# Patient Record
Sex: Female | Born: 1965 | Hispanic: Yes | Smoking: Never smoker
Health system: Southern US, Community
[De-identification: ages and names within clinical notes are randomized; demographics above are authoritative.]

## PROBLEM LIST (undated history)

## (undated) DIAGNOSIS — F329 Major depressive disorder, single episode, unspecified: Secondary | ICD-10-CM

## (undated) DIAGNOSIS — F32A Depression, unspecified: Secondary | ICD-10-CM

## (undated) DIAGNOSIS — G510 Bell's palsy: Secondary | ICD-10-CM

## (undated) HISTORY — PX: ABDOMINAL HYSTERECTOMY: SHX81

## (undated) HISTORY — PX: VAGINA SURGERY: SHX829

---

## 1999-07-24 ENCOUNTER — Encounter: Admission: RE | Admit: 1999-07-24 | Discharge: 1999-07-24 | Payer: Self-pay | Admitting: *Deleted

## 1999-08-05 ENCOUNTER — Emergency Department (HOSPITAL_COMMUNITY): Admission: EM | Admit: 1999-08-05 | Discharge: 1999-08-05 | Payer: Self-pay | Admitting: Emergency Medicine

## 2009-04-12 ENCOUNTER — Emergency Department (HOSPITAL_BASED_OUTPATIENT_CLINIC_OR_DEPARTMENT_OTHER): Admission: EM | Admit: 2009-04-12 | Discharge: 2009-04-12 | Payer: Self-pay | Admitting: Emergency Medicine

## 2011-03-10 LAB — RAPID STREP SCREEN (MED CTR MEBANE ONLY): Streptococcus, Group A Screen (Direct): POSITIVE — AB

## 2015-03-27 ENCOUNTER — Encounter (HOSPITAL_BASED_OUTPATIENT_CLINIC_OR_DEPARTMENT_OTHER): Payer: Self-pay

## 2015-03-27 ENCOUNTER — Emergency Department (HOSPITAL_BASED_OUTPATIENT_CLINIC_OR_DEPARTMENT_OTHER)
Admission: EM | Admit: 2015-03-27 | Discharge: 2015-03-27 | Disposition: A | Discharge status: Home / Self Care | Payer: Medicare Other | Attending: Emergency Medicine | Admitting: Emergency Medicine

## 2015-03-27 DIAGNOSIS — S4992XA Unspecified injury of left shoulder and upper arm, initial encounter: Secondary | ICD-10-CM | POA: Diagnosis not present

## 2015-03-27 DIAGNOSIS — S39012A Strain of muscle, fascia and tendon of lower back, initial encounter: Secondary | ICD-10-CM | POA: Insufficient documentation

## 2015-03-27 DIAGNOSIS — Y999 Unspecified external cause status: Secondary | ICD-10-CM | POA: Diagnosis not present

## 2015-03-27 DIAGNOSIS — S24109A Unspecified injury at unspecified level of thoracic spinal cord, initial encounter: Secondary | ICD-10-CM | POA: Diagnosis not present

## 2015-03-27 DIAGNOSIS — S0990XA Unspecified injury of head, initial encounter: Secondary | ICD-10-CM | POA: Insufficient documentation

## 2015-03-27 DIAGNOSIS — S4991XA Unspecified injury of right shoulder and upper arm, initial encounter: Secondary | ICD-10-CM | POA: Insufficient documentation

## 2015-03-27 DIAGNOSIS — S161XXA Strain of muscle, fascia and tendon at neck level, initial encounter: Secondary | ICD-10-CM | POA: Diagnosis not present

## 2015-03-27 DIAGNOSIS — Y9389 Activity, other specified: Secondary | ICD-10-CM | POA: Insufficient documentation

## 2015-03-27 DIAGNOSIS — S199XXA Unspecified injury of neck, initial encounter: Secondary | ICD-10-CM | POA: Diagnosis present

## 2015-03-27 DIAGNOSIS — Y92410 Unspecified street and highway as the place of occurrence of the external cause: Secondary | ICD-10-CM | POA: Diagnosis not present

## 2015-03-27 LAB — URINALYSIS, ROUTINE W REFLEX MICROSCOPIC
BILIRUBIN URINE: NEGATIVE
GLUCOSE, UA: NEGATIVE mg/dL
HGB URINE DIPSTICK: NEGATIVE
Ketones, ur: NEGATIVE mg/dL
Nitrite: NEGATIVE
Protein, ur: NEGATIVE mg/dL
Specific Gravity, Urine: 1.021 (ref 1.005–1.030)
Urobilinogen, UA: 0.2 mg/dL (ref 0.0–1.0)
pH: 5.5 (ref 5.0–8.0)

## 2015-03-27 LAB — URINE MICROSCOPIC-ADD ON

## 2015-03-27 MED ORDER — CYCLOBENZAPRINE HCL 10 MG PO TABS
10.0000 mg | ORAL_TABLET | Freq: Once | ORAL | Status: AC
  Administered 2015-03-27: 10 mg via ORAL
  Filled 2015-03-27: qty 1

## 2015-03-27 MED ORDER — KETOROLAC TROMETHAMINE 60 MG/2ML IM SOLN
60.0000 mg | Freq: Once | INTRAMUSCULAR | Status: AC
  Administered 2015-03-27: 60 mg via INTRAMUSCULAR
  Filled 2015-03-27: qty 2

## 2015-03-27 MED ORDER — CYCLOBENZAPRINE HCL 10 MG PO TABS: 10.0000 mg | ORAL_TABLET | Freq: Two times a day (BID) | ORAL | Status: DC | PRN

## 2015-03-27 MED ORDER — NAPROXEN 500 MG PO TABS: 500.0000 mg | ORAL_TABLET | Freq: Two times a day (BID) | ORAL | Status: DC

## 2015-03-27 NOTE — ED Notes (Signed)
[employer] on Sunday  ? 5 minutes loc,  C/o back.neck, shoulder and head pain
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
## 2015-03-29 LAB — URINE CULTURE: Colony Count: 45000

## 2015-07-17 ENCOUNTER — Encounter (HOSPITAL_COMMUNITY): Payer: Self-pay | Admitting: *Deleted

## 2015-07-17 DIAGNOSIS — R200 Anesthesia of skin: Secondary | ICD-10-CM | POA: Insufficient documentation

## 2015-07-17 DIAGNOSIS — Z79899 Other long term (current) drug therapy: Secondary | ICD-10-CM | POA: Insufficient documentation

## 2015-07-17 DIAGNOSIS — Z7952 Long term (current) use of systemic steroids: Secondary | ICD-10-CM | POA: Insufficient documentation

## 2015-07-17 DIAGNOSIS — R51 Headache: Secondary | ICD-10-CM | POA: Insufficient documentation

## 2015-07-17 DIAGNOSIS — G510 Bell's palsy: Secondary | ICD-10-CM | POA: Diagnosis not present

## 2015-07-17 NOTE — ED Notes (Signed)
The pt was diagnosed with bells palsy 2 weeks ago.  Since then she has had  A headache and sone neck stiffness with pressure.lmp none c/o nausea

## 2015-07-18 ENCOUNTER — Emergency Department (HOSPITAL_COMMUNITY)
Admission: EM | Admit: 2015-07-18 | Discharge: 2015-07-18 | Disposition: A | Discharge status: Home / Self Care | Payer: Medicare Other | Attending: Emergency Medicine | Admitting: Emergency Medicine

## 2015-07-18 ENCOUNTER — Emergency Department (HOSPITAL_COMMUNITY): Payer: Medicare Other

## 2015-07-18 ENCOUNTER — Encounter (HOSPITAL_COMMUNITY): Payer: Self-pay

## 2015-07-18 DIAGNOSIS — R519 Headache, unspecified: Secondary | ICD-10-CM

## 2015-07-18 DIAGNOSIS — R51 Headache: Secondary | ICD-10-CM

## 2015-07-18 DIAGNOSIS — R200 Anesthesia of skin: Secondary | ICD-10-CM

## 2015-07-18 DIAGNOSIS — G510 Bell's palsy: Secondary | ICD-10-CM

## 2015-07-18 LAB — COMPREHENSIVE METABOLIC PANEL
ALBUMIN: 3.9 g/dL (ref 3.5–5.0)
ALK PHOS: 60 U/L (ref 38–126)
ALT: 17 U/L (ref 14–54)
ANION GAP: 9 (ref 5–15)
AST: 23 U/L (ref 15–41)
BILIRUBIN TOTAL: 0.7 mg/dL (ref 0.3–1.2)
BUN: 12 mg/dL (ref 6–20)
CALCIUM: 8.9 mg/dL (ref 8.9–10.3)
CO2: 24 mmol/L (ref 22–32)
Chloride: 106 mmol/L (ref 101–111)
Creatinine, Ser: 0.9 mg/dL (ref 0.44–1.00)
GFR calc Af Amer: >60 mL/min (ref >60)
GLUCOSE: 86 mg/dL (ref 65–99)
Potassium: 3.5 mmol/L (ref 3.5–5.1)
Sodium: 139 mmol/L (ref 135–145)
TOTAL PROTEIN: 6.5 g/dL (ref 6.5–8.1)

## 2015-07-18 LAB — DIFFERENTIAL
Basophils Absolute: 0 10*3/uL (ref 0.0–0.1)
Basophils Relative: 0 % (ref 0–1)
EOS PCT: 1 % (ref 0–5)
Eosinophils Absolute: 0.1 10*3/uL (ref 0.0–0.7)
LYMPHS ABS: 3.2 10*3/uL (ref 0.7–4.0)
LYMPHS PCT: 33 % (ref 12–46)
MONOS PCT: 6 % (ref 3–12)
Monocytes Absolute: 0.6 10*3/uL (ref 0.1–1.0)
NEUTROS PCT: 60 % (ref 43–77)
Neutro Abs: 5.9 10*3/uL (ref 1.7–7.7)

## 2015-07-18 LAB — CSF CELL COUNT WITH DIFFERENTIAL
RBC COUNT CSF: 1 /mm3 — AB
RBC Count, CSF: 0 /mm3
Tube #: 1
Tube #: 4
WBC CSF: 2 /mm3 (ref 0–5)
WBC, CSF: 1 /mm3 (ref 0–5)

## 2015-07-18 LAB — URINE MICROSCOPIC-ADD ON

## 2015-07-18 LAB — PROTIME-INR
INR: 1.06 (ref 0.00–1.49)
Prothrombin Time: 14 seconds (ref 11.6–15.2)

## 2015-07-18 LAB — CBC
HEMATOCRIT: 40.8 % (ref 36.0–46.0)
HEMOGLOBIN: 14 g/dL (ref 12.0–15.0)
MCH: 29.9 pg (ref 26.0–34.0)
MCHC: 34.3 g/dL (ref 30.0–36.0)
MCV: 87.2 fL (ref 78.0–100.0)
Platelets: 282 10*3/uL (ref 150–400)
RBC: 4.68 MIL/uL (ref 3.87–5.11)
RDW: 13.3 % (ref 11.5–15.5)
WBC: 9.8 10*3/uL (ref 4.0–10.5)

## 2015-07-18 LAB — URINALYSIS, ROUTINE W REFLEX MICROSCOPIC
Bilirubin Urine: NEGATIVE
GLUCOSE, UA: NEGATIVE mg/dL
Ketones, ur: NEGATIVE mg/dL
Nitrite: NEGATIVE
PH: 5.5 (ref 5.0–8.0)
Protein, ur: NEGATIVE mg/dL
SPECIFIC GRAVITY, URINE: 1.011 (ref 1.005–1.030)
Urobilinogen, UA: 0.2 mg/dL (ref 0.0–1.0)

## 2015-07-18 LAB — APTT: aPTT: 27 seconds (ref 24–37)

## 2015-07-18 LAB — I-STAT TROPONIN, ED: TROPONIN I, POC: 0 ng/mL (ref 0.00–0.08)

## 2015-07-18 LAB — ETHANOL: Alcohol, Ethyl (B): <5 mg/dL (ref <5)

## 2015-07-18 MED ORDER — IOHEXOL 350 MG/ML SOLN
50.0000 mL | Freq: Once | INTRAVENOUS | Status: AC | PRN
  Administered 2015-07-18: 50 mL via INTRAVENOUS

## 2015-07-18 MED ORDER — SODIUM CHLORIDE 0.9 % IV BOLUS (SEPSIS)
1000.0000 mL | Freq: Once | INTRAVENOUS | Status: AC
  Administered 2015-07-18: 1000 mL via INTRAVENOUS

## 2015-07-18 MED ORDER — NAPROXEN 500 MG PO TABS
500.0000 mg | ORAL_TABLET | Freq: Two times a day (BID) | ORAL | Status: DC | PRN
Start: 2015-07-18 — End: 2017-02-02

## 2015-07-18 MED ORDER — KETOROLAC TROMETHAMINE 30 MG/ML IJ SOLN
30.0000 mg | Freq: Once | INTRAMUSCULAR | Status: AC
  Administered 2015-07-18: 30 mg via INTRAVENOUS
  Filled 2015-07-18: qty 1

## 2015-07-18 MED ORDER — METOCLOPRAMIDE HCL 5 MG/ML IJ SOLN
10.0000 mg | Freq: Once | INTRAMUSCULAR | Status: AC
  Administered 2015-07-18: 10 mg via INTRAVENOUS
  Filled 2015-07-18: qty 2

## 2015-07-18 MED ORDER — FENTANYL CITRATE (PF) 100 MCG/2ML IJ SOLN
50.0000 ug | Freq: Once | INTRAMUSCULAR | Status: AC
  Administered 2015-07-18: 50 ug via INTRAVENOUS
  Filled 2015-07-18: qty 2

## 2015-07-18 NOTE — ED Notes (Signed)
Pt back from MRI 

## 2015-07-18 NOTE — ED Notes (Signed)
Pt to CT

## 2015-07-18 NOTE — ED Notes (Signed)
Pt to MRI

## 2015-07-18 NOTE — ED Notes (Signed)
Pt to ED c/o L posterior headache. Reports hx of one migraine in 2000, reports this headache feels worse. Pt ambulatory without difficulty. Recently diagnosed with "facial palsy" and has been taking "a pill that makes my headache worse." Denies photophobia.

## 2015-07-18 NOTE — ED Notes (Signed)
LP tray at bedside

## 2015-07-18 NOTE — ED Provider Notes (Signed)
CSN: [id number]     Arrival date & time 07/17/15  2214 History  This chart was scribed for Derwood Kaplan, MD by Octavia Heir, ED Scribe. This patient was seen in room D30C/D30C and the patient's care was started at 1:00 AM.    Chief Complaint  Patient presents with  . Headache     The history is provided by the patient. No language interpreter was used.   HPI Comments: Virginia King is a 49 y.o. female who presents to the Emergency Department complaining of constant, gradual worsening left sided headache onset 2 weeks ago. Pt also has associated left arm weakness and numbness that started two days ago. Pt notes she has mild blurry vision. She states the pain radiates from the left side of her head to her neck and shoulder that started last night. Per daughter, pt was dizzy today and was very nauseas. Pt has trouble sleeping at night due to the pain. Pt was dx with bells palsy 2 weeks ago at [facility]. Pt told them about her headache and was given tylenol to alleviate the pain with no relief. Pt has maternal family hx of brain bleeds. Pt denies fevers, recent travel abroad. Pt is a non-smoker.   History reviewed. No pertinent past medical history. History reviewed. No pertinent past surgical history. No family history on file. Social History  Substance Use Topics  . Smoking status: Never Smoker   . Smokeless tobacco: None  . Alcohol Use: No   OB History    No data available     Review of Systems  A complete 10 system review of systems was obtained and all systems are negative except as noted in the HPI and PMH.    Allergies  Review of patient's allergies indicates no known allergies.  Home Medications   Prior to Admission medications   Medication Sig Start Date End Date Taking? Authorizing Provider  paliperidone (INVEGA) 9 MG 24 hr tablet Take 18 mg by mouth daily.   Yes Historical Provider, MD  predniSONE (DELTASONE) 20 MG tablet Take 60 mg by mouth daily with breakfast.    Yes Historical Provider, MD  QUEtiapine (SEROQUEL XR) 200 MG 24 hr tablet Take 600 mg by mouth at bedtime.   Yes Historical Provider, MD  sertraline (ZOLOFT) 100 MG tablet Take 200 mg by mouth daily.   Yes Historical Provider, MD  traZODone (DESYREL) 100 MG tablet Take 100 mg by mouth at bedtime as needed for sleep.   Yes Historical Provider, MD  valACYclovir (VALTREX) 1000 MG tablet Take 1,000 mg by mouth 3 (three) times daily.   Yes Historical Provider, MD  zolpidem (AMBIEN) 10 MG tablet Take 10 mg by mouth at bedtime as needed for sleep.   Yes Historical Provider, MD    Triage vitals: BP 131/83 mmHg  Pulse 58  Temp(Src) 98.5 F (36.9 C) (Oral)  Resp 20  SpO2 100% Physical Exam  Constitutional: She is oriented to person, place, and time. She appears well-developed and well-nourished. No distress.  HENT:  Head: Normocephalic and atraumatic.  Eyes: EOM are normal.  Neck: Normal range of motion.  Mild neck stiffness but ROM is normal, no carotid bruey, no nuchal rigidity, no meningismus   Cardiovascular: Normal rate, regular rhythm and normal heart sounds.   Pulmonary/Chest: Effort normal and breath sounds normal.  Lungs are clear  Abdominal: Soft. She exhibits no distension. There is no tenderness.  Musculoskeletal: Normal range of motion.  LUE numbness, LLE numbness  Neurological:  She is alert and oriented to person, place, and time.   Pupils are 3mm, extraocular muscles intact, left sided facial paralysis, no central sparing of the forehead, left sided numbness, 4+ out of 5 strength bilaterally but pt has drift of left upper extremity, 4+ out of 5 strength bilateral lower extremity, positive drift for left lower extremity  Skin: Skin is warm and dry.  Psychiatric: She has a normal mood and affect. Judgment normal.  Nursing note and vitals reviewed.   ED Course  LUMBAR PUNCTURE Date/Time: 07/18/2015 9:15 AM Performed by: Derwood Kaplan Authorized by: Derwood Kaplan Consent:  Verbal consent obtained. Risks and benefits: risks, benefits and alternatives were discussed Patient understanding: patient states understanding of the procedure being performed Test results: test results available and properly labeled Site marked: the operative site was marked Patient identity confirmed: arm band Indications: evaluation for subarachnoid hemorrhage Anesthesia: local infiltration Local anesthetic: lidocaine 1% with epinephrine Anesthetic total: 2 ml Patient sedated: no Preparation: Patient was prepped and draped in the usual sterile fashion. Lumbar space: L4-L5 interspace Patient's position: sitting Needle gauge: 18 Needle type: diamond point Number of attempts: 2 Fluid appearance: clear Tubes of fluid: 4 Total volume: 10 ml Post-procedure: site cleaned, pressure dressing applied and adhesive bandage applied Patient tolerance: Patient tolerated the procedure well with no immediate complications    DIAGNOSTIC STUDIES: Oxygen Saturation is 100% on RA, normal by my interpretation.  COORDINATION OF CARE:  1:13 AM Discussed treatment plan which includes CT of head, pain medication with pt at bedside and pt agreed to plan.  Labs Review Labs Reviewed  URINALYSIS, ROUTINE W REFLEX MICROSCOPIC (NOT AT [facility]) - Abnormal; Notable for the following:    Hgb urine dipstick TRACE (*)    Leukocytes, UA TRACE (*)    All other components within normal limits  URINE MICROSCOPIC-ADD ON - Abnormal; Notable for the following:    Squamous Epithelial / LPF FEW (*)    Bacteria, UA FEW (*)    All other components within normal limits  CSF CULTURE  ETHANOL  PROTIME-INR  APTT  CBC  DIFFERENTIAL  COMPREHENSIVE METABOLIC PANEL  CSF CELL COUNT WITH DIFFERENTIAL  CSF CELL COUNT WITH DIFFERENTIAL  I-STAT TROPOININ, ED    Imaging Review Ct Angio Head W/cm &/or Wo Cm  07/18/2015   CLINICAL DATA:  Initial evaluation for acute neck pain, headache. History of aneurysm.  EXAM: CT  ANGIOGRAPHY HEAD AND NECK  TECHNIQUE: Multidetector CT imaging of the head and neck was performed using the standard protocol during bolus administration of intravenous contrast. Multiplanar CT image reconstructions and MIPs were obtained to evaluate the vascular anatomy. Carotid stenosis measurements (when applicable) are obtained utilizing NASCET criteria, using the distal internal carotid diameter as the denominator.  CONTRAST:  50mL OMNIPAQUE IOHEXOL 350 MG/ML SOLN  COMPARISON:  Prior noncontrast head CT from earlier the same day.  FINDINGS: CTA NECK  Aortic arch: Visualized aortic arch is of normal caliber. Incidental note made of a bovine arch with common origin of the right brachiocephalic and left common carotid arteries. No high-grade stenosis seen at the origin of the great vessels. Visualized subclavian arteries are widely patent.  Right carotid system: Evaluation of the proximal right common carotid artery somewhat limited by motion artifact, but is grossly patent. Right common carotid artery well opacified from its origin to the carotid bifurcation. Right internal carotid artery widely patent from the carotid bifurcation to the skullbase. No evidence for dissection, stenosis, or occlusion.  Left carotid  system: Left common carotid artery widely patent from its origin to the carotid bifurcation. Left internal carotid artery widely patent from the carotid bifurcation to the skullbase. No evidence for dissection, occlusion, or stenosis.  Vertebral arteries:Both of the vertebral arteries arise from the subclavian arteries. Vertebral arteries are well opacified along their entire course without evidence for dissection, occlusion, or stenosis.  Skeleton: No acute osseous abnormality. No worrisome lytic or blastic osseous lesions.  Other neck: Visualized lungs are clear. Subcentimeter hypodense nodule noted within the right lobe of thyroid, of doubtful clinical significance. Thyroid otherwise unremarkable. No  adenopathy. No acute soft tissue abnormality within the neck.  CTA HEAD  Anterior circulation: The petrous, cavernous, and supraclinoid segments of the internal carotid arteries are widely patent bilaterally. There is a small 2-3 mm focal outpouching arising from the supra clinoid left ICA at the takeoff of the left ophthalmic artery, suspicious for a small aneurysm. This is directed laterally and slightly anteriorly.  A1 segments, anterior communicating artery, and anterior cerebral arteries well opacified bilaterally.  M1 segments widely patent bilaterally. MCA bifurcations within normal limits. Distal MCA branches well opacified and symmetric.  Posterior circulation: Vertebral arteries are widely patent to the vertebrobasilar junction. Right vertebral artery slightly dominant. Posterior inferior cerebral arteries patent bilaterally. Basilar artery widely patent. Superior cerebellar arteries are patent proximally. There is fetal origin of the left posterior cerebral artery. Right P1 segment patent. Posterior cerebral arteries are opacified to their distal aspects.  Venous sinuses: No acute abnormality within the venous sinuses.  Anatomic variants: Fetal origin of the left PCA.  Delayed phase: No abnormal enhancement on delayed sequence.  IMPRESSION: 1. Normal CTA of the neck without acute abnormality or stenosis. 2. 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 Otherwise normal CTA of the brain.   Electronically Signed   By: Rise Mu M.D.   On: 07/18/2015 04:32   Ct Head Wo Contrast  07/18/2015   CLINICAL DATA:  Left posterior headache and left-sided weakness.  EXAM: CT HEAD WITHOUT CONTRAST  TECHNIQUE: Contiguous axial images were obtained from the base of the skull through the vertex without intravenous contrast.  COMPARISON:  None available  FINDINGS: Skull and Sinuses:Negative for fracture or destructive process. The mastoids, middle ears, and imaged paranasal sinuses are clear.  Orbits: No acute  abnormality.  Brain: Unremarkable. No evidence of acute infarction, hemorrhage, hydrocephalus, or mass lesion/mass effect.  IMPRESSION: Negative head CT.   Electronically Signed   By: Marnee Spring M.D.   On: 07/18/2015 02:35   Ct Angio Neck W/cm &/or Wo/cm  07/18/2015   CLINICAL DATA:  Initial evaluation for acute neck pain, headache. History of aneurysm.  EXAM: CT ANGIOGRAPHY HEAD AND NECK  TECHNIQUE: Multidetector CT imaging of the head and neck was performed using the standard protocol during bolus administration of intravenous contrast. Multiplanar CT image reconstructions and MIPs were obtained to evaluate the vascular anatomy. Carotid stenosis measurements (when applicable) are obtained utilizing NASCET criteria, using the distal internal carotid diameter as the denominator.  CONTRAST:  50mL OMNIPAQUE IOHEXOL 350 MG/ML SOLN  COMPARISON:  Prior noncontrast head CT from earlier the same day.  FINDINGS: CTA NECK  Aortic arch: Visualized aortic arch is of normal caliber. Incidental note made of a bovine arch with common origin of the right brachiocephalic and left common carotid arteries. No high-grade stenosis seen at the origin of the great vessels. Visualized subclavian arteries are widely patent.  Right carotid system: Evaluation of the proximal right common  carotid artery somewhat limited by motion artifact, but is grossly patent. Right common carotid artery well opacified from its origin to the carotid bifurcation. Right internal carotid artery widely patent from the carotid bifurcation to the skullbase. No evidence for dissection, stenosis, or occlusion.  Left carotid system: Left common carotid artery widely patent from its origin to the carotid bifurcation. Left internal carotid artery widely patent from the carotid bifurcation to the skullbase. No evidence for dissection, occlusion, or stenosis.  Vertebral arteries:Both of the vertebral arteries arise from the subclavian arteries. Vertebral  arteries are well opacified along their entire course without evidence for dissection, occlusion, or stenosis.  Skeleton: No acute osseous abnormality. No worrisome lytic or blastic osseous lesions.  Other neck: Visualized lungs are clear. Subcentimeter hypodense nodule noted within the right lobe of thyroid, of doubtful clinical significance. Thyroid otherwise unremarkable. No adenopathy. No acute soft tissue abnormality within the neck.  CTA HEAD  Anterior circulation: The petrous, cavernous, and supraclinoid segments of the internal carotid arteries are widely patent bilaterally. There is a small 2-3 mm focal outpouching arising from the supra clinoid left ICA at the takeoff of the left ophthalmic artery, suspicious for a small aneurysm. This is directed laterally and slightly anteriorly.  A1 segments, anterior communicating artery, and anterior cerebral arteries well opacified bilaterally.  M1 segments widely patent bilaterally. MCA bifurcations within normal limits. Distal MCA branches well opacified and symmetric.  Posterior circulation: Vertebral arteries are widely patent to the vertebrobasilar junction. Right vertebral artery slightly dominant. Posterior inferior cerebral arteries patent bilaterally. Basilar artery widely patent. Superior cerebellar arteries are patent proximally. There is fetal origin of the left posterior cerebral artery. Right P1 segment patent. Posterior cerebral arteries are opacified to their distal aspects.  Venous sinuses: No acute abnormality within the venous sinuses.  Anatomic variants: Fetal origin of the left PCA.  Delayed phase: No abnormal enhancement on delayed sequence.  IMPRESSION: 1. Normal CTA of the neck without acute abnormality or stenosis. 2. 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 Otherwise normal CTA of the brain.   Electronically Signed   By: Rise Mu M.D.   On: 07/18/2015 04:32   Mr Brain Wo Contrast  07/18/2015   CLINICAL DATA:  49 year old female  with severe left side headache x2 weeks. Left upper extremity weakness and numbness for 2 days. Blurred vision. Diagnosed with Bell palsy 2 weeks ago. Initial encounter.  EXAM: MRI HEAD WITHOUT CONTRAST  TECHNIQUE: Multiplanar, multiecho pulse sequences of the brain and surrounding structures were obtained without intravenous contrast.  COMPARISON:  CTA head and neck 0333 hours today.  FINDINGS: Cerebral volume is normal. No restricted diffusion to suggest acute infarction. No midline shift, mass effect, evidence of mass lesion, ventriculomegaly, extra-axial collection or acute intracranial hemorrhage. Cervicomedullary junction and pituitary are within normal limits. Major intracranial vascular flow voids are preserved.  Wallace Cullens and white matter signal is within normal limits for age throughout the brain. Grossly normal visualized internal auditory structures. Mastoids are clear. Stylomastoid foramina appear normal. Visualized parotid glands are within normal limits.  Visualized orbit and scalp soft tissues are within normal limits. Paranasal sinuses are clear. Negative visualized cervical spine. Visualized bone marrow signal is within normal limits.  IMPRESSION: No acute intracranial abnormality. Normal for age noncontrast MRI appearance of the brain.   Electronically Signed   By: Odessa Fleming M.D.   On: 07/18/2015 07:52   I have personally reviewed and evaluated lab results as part of my medical decision-making.  EKG Interpretation   Date/Time:  Thursday July 18 2015 02:13:54 EDT Ventricular Rate:  54 PR Interval:  148 QRS Duration: 97 QT Interval:  440 QTC Calculation: 417 R Axis:   56 Text Interpretation:  Sinus rhythm Low voltage, precordial leads No acute  changes No old tracing to compare Confirmed by Rhunette Croft, MD, Janey Genta [phone])  on 07/18/2015 2:55:31 AM      MDM   Final diagnoses:  Bad headache  Bell's palsy  Left sided numbness    I personally performed the services described in this  documentation, which was scribed in my presence. The recorded information has been reviewed and is accurate.  PT comes in with cc of headache. Pt reports that the headache is severe, the worst headache she has ever had. The headache is also new. Hx of brain  Bleed in the family. Pt certainly has L sided bell's palsy on exam, but she also has new weakness and numbness to the L side. CT head and CT-A ordered - there is no subacute stroke or carotid dissection. There is a small AN on the L side, which is not the culprit for her L sided symptoms - pt notified of the small incidental aneurysm, and she will f/u with Neurology for monitoring.  Pt given fentanyl and then toradol and reglan. She still complained of severe headache. Neuro exam unchanged.  I spoke with Dr. Leroy Kennedy over the phone, and the Neurologist advised MRI to ensure no small infarct. The MRI will also exclude venous and dural thrombosis that can cause headaches. He also advised LP to r/o bleed.  Results discussed with the patient and LP conversation was completed. She agreed to proceed with LP. LP was completed w/o any complications. MRI is neg - pt made aware of it. Dr. Criss Alvine to f/u with the CSF analysis, and d/c pt if they appear normal.   Derwood Kaplan, MD 07/18/15 [phone]

## 2015-07-18 NOTE — ED Notes (Signed)
Pt in MRI.

## 2015-07-18 NOTE — ED Provider Notes (Signed)
11:31 AM Patient's LP is normal. Normal MRI as well. Plan for d/c with neurology follow up and she is requesting ENT f/u for her Bell's Palsy. She is already on steroids and valacyclovir. Will treat headache with NSAIDs. Verbalized understanding of return precautions.  Pricilla Loveless, MD 07/18/15 [phone]

## 2015-07-18 NOTE — Discharge Instructions (Signed)
We saw you in the ER for headaches. All the labs and imaging are normal. 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  We are not sure what is causing your headaches, however, there appears to be no evidence of infection, bleeds or tumors based on our exam and results.  Please take motrin round the clock for the next 6 hours, and take other meds prescribed only for break through pain. See the neurologist if the pain persists, as you might need better medications or a specialist.  PLEASE LAY FLAT IN THE BED AS LONG AS POSSIBLE.   Headaches, Frequently Asked Questions MIGRAINE HEADACHES Q: What is migraine? What causes it? How can I treat it? A: Generally, migraine headaches begin as a dull ache. Then they develop into a constant, throbbing, and pulsating pain. You may experience pain at the temples. You may experience pain at the front or back of one or both sides of the head. The pain is usually accompanied by a combination of:  Nausea.  Vomiting.  Sensitivity to light and noise. Some people (about 15%) experience an aura (see below) before an attack. The cause of migraine is believed to be chemical reactions in the brain. Treatment for migraine may include over-the-counter or prescription medications. It may also include self-help techniques. These include relaxation training and biofeedback.  Q: What is an aura? A: About 15% of people with migraine get an "aura". This is a sign of neurological symptoms that occur before a migraine headache. You may see wavy or jagged lines, dots, or flashing lights. You might experience tunnel vision or blind spots in one or both eyes. The aura can include visual or auditory hallucinations (something imagined). It may include disruptions in smell (such as strange odors), taste or touch. Other symptoms include:  Numbness.  A "pins and needles" sensation.  Difficulty in recalling or speaking the correct word. These  neurological events may last as long as 60 minutes. These symptoms will fade as the headache begins. Q: What is a trigger? A: Certain physical or environmental factors can lead to or "trigger" a migraine. These include:  Foods.  Hormonal changes.  Weather.  Stress. It is important to remember that triggers are different for everyone. To help prevent migraine attacks, you need to figure out which triggers affect you. Keep a headache diary. This is a good way to track triggers. The diary will help you talk to your healthcare professional about your condition. Q: Does weather affect migraines? A: Bright sunshine, hot, humid conditions, and drastic changes in barometric pressure may lead to, or "trigger," a migraine attack in some people. But studies have shown that weather does not act as a trigger for everyone with migraines. Q: What is the link between migraine and hormones? A: Hormones start and regulate many of your body's functions. Hormones keep your body in balance within a constantly changing environment. The levels of hormones in your body are unbalanced at times. Examples are during menstruation, pregnancy, or menopause. That can lead to a migraine attack. In fact, about three quarters of all women with migraine report that their attacks are related to the menstrual cycle.  Q: Is there an increased risk of stroke for migraine sufferers? A: The likelihood of a migraine attack causing a stroke is very remote. That is not to say that migraine sufferers cannot have a stroke associated with their migraines. In persons under age 28, the most common associated factor for  stroke is migraine headache. But over the course of a person's normal life span, the occurrence of migraine headache may actually be associated with a reduced risk of dying from cerebrovascular disease due to stroke.  Q: What are acute medications for migraine? A: Acute medications are used to treat the pain of the headache after  it has started. Examples over-the-counter medications, NSAIDs, ergots, and triptans.  Q: What are the triptans? A: Triptans are the newest class of abortive medications. They are specifically targeted to treat migraine. Triptans are vasoconstrictors. They moderate some chemical reactions in the brain. The triptans work on receptors in your brain. Triptans help to restore the balance of a neurotransmitter called serotonin. Fluctuations in levels of serotonin are thought to be a main cause of migraine.  Q: Are over-the-counter medications for migraine effective? A: Over-the-counter, or "OTC," medications may be effective in relieving mild to moderate pain and associated symptoms of migraine. But you should see your caregiver before beginning any treatment regimen for migraine.  Q: What are preventive medications for migraine? A: Preventive medications for migraine are sometimes referred to as "prophylactic" treatments. They are used to reduce the frequency, severity, and length of migraine attacks. Examples of preventive medications include antiepileptic medications, antidepressants, beta-blockers, calcium channel blockers, and NSAIDs (nonsteroidal anti-inflammatory drugs). Q: Why are anticonvulsants used to treat migraine? A: During the past few years, there has been an increased interest in antiepileptic drugs for the prevention of migraine. They are sometimes referred to as "anticonvulsants". Both epilepsy and migraine may be caused by similar reactions in the brain.  Q: Why are antidepressants used to treat migraine? A: Antidepressants are typically used to treat people with depression. They may reduce migraine frequency by regulating chemical levels, such as serotonin, in the brain.  Q: What alternative therapies are used to treat migraine? A: The term "alternative therapies" is often used to describe treatments considered outside the scope of conventional Western medicine. Examples of alternative  therapy include acupuncture, acupressure, and yoga. Another common alternative treatment is herbal therapy. Some herbs are believed to relieve headache pain. Always discuss alternative therapies with your caregiver before proceeding. Some herbal products contain arsenic and other toxins. TENSION HEADACHES Q: What is a tension-type headache? What causes it? How can I treat it? A: Tension-type headaches occur randomly. They are often the result of temporary stress, anxiety, fatigue, or anger. Symptoms include soreness in your temples, a tightening band-like sensation around your head (a "vice-like" ache). Symptoms can also include a pulling feeling, pressure sensations, and contracting head and neck muscles. The headache begins in your forehead, temples, or the back of your head and neck. Treatment for tension-type headache may include over-the-counter or prescription medications. Treatment may also include self-help techniques such as relaxation training and biofeedback. CLUSTER HEADACHES Q: What is a cluster headache? What causes it? How can I treat it? A: Cluster headache gets its name because the attacks come in groups. The pain arrives with little, if any, warning. It is usually on one side of the head. A tearing or bloodshot eye and a runny nose on the same side of the headache may also accompany the pain. Cluster headaches are believed to be caused by chemical reactions in the brain. They have been described as the most severe and intense of any headache type. Treatment for cluster headache includes prescription medication and oxygen. SINUS HEADACHES Q: What is a sinus headache? What causes it? How can I treat it? A: When a cavity in  the bones of the face and skull (a sinus) becomes inflamed, the inflammation will cause localized pain. This condition is usually the result of an allergic reaction, a tumor, or an infection. If your headache is caused by a sinus blockage, such as an infection, you will  probably have a fever. An x-ray will confirm a sinus blockage. Your caregiver's treatment might include antibiotics for the infection, as well as antihistamines or decongestants.  REBOUND HEADACHES Q: What is a rebound headache? What causes it? How can I treat it? A: A pattern of taking acute headache medications too often can lead to a condition known as "rebound headache." A pattern of taking too much headache medication includes taking it more than 2 days per week or in excessive amounts. That means more than the label or a caregiver advises. With rebound headaches, your medications not only stop relieving pain, they actually begin to cause headaches. Doctors treat rebound headache by tapering the medication that is being overused. Sometimes your caregiver will gradually substitute a different type of treatment or medication. Stopping may be a challenge. Regularly overusing a medication increases the potential for serious side effects. Consult a caregiver if you regularly use headache medications more than 2 days per week or more than the label advises. ADDITIONAL QUESTIONS AND ANSWERS Q: What is biofeedback? A: Biofeedback is a self-help treatment. Biofeedback uses special equipment to monitor your body's involuntary physical responses. Biofeedback monitors:  Breathing.  Pulse.  Heart rate.  Temperature.  Muscle tension.  Brain activity. Biofeedback helps you refine and perfect your relaxation exercises. You learn to control the physical responses that are related to stress. Once the technique has been mastered, you do not need the equipment any more. Q: Are headaches hereditary? A: Four out of five (80%) of people that suffer report a family history of migraine. Scientists are not sure if this is genetic or a family predisposition. Despite the uncertainty, a child has a 50% chance of having migraine if one parent suffers. The child has a 75% chance if both parents suffer.  Q: Can children  get headaches? A: By the time they reach high school, most young people have experienced some type of headache. Many safe and effective approaches or medications can prevent a headache from occurring or stop it after it has begun.  Q: What type of doctor should I see to diagnose and treat my headache? A: Start with your primary caregiver. Discuss his or her experience and approach to headaches. Discuss methods of classification, diagnosis, and treatment. Your caregiver may decide to recommend you to a headache specialist, depending upon your symptoms or other physical conditions. Having diabetes, allergies, etc., may require a more comprehensive and inclusive approach to your headache. The National Head[employer] Foundation will provide, upon request, a list of [facility] physician members in your state. Document Released: 02/06/2004 Document Revised: 02/08/2012 Document Reviewed: 07/16/2008 [facility] Patient Information 2015 [place], [place]. This information is not intended to replace advice given to you by your health care provider. Make sure you discuss any questions you have with your health care provider.

## 2015-07-18 NOTE — ED Notes (Signed)
Patient transported to CT 

## 2015-07-21 LAB — CSF CULTURE W GRAM STAIN: Culture: NO GROWTH

## 2015-07-21 LAB — CSF CULTURE

## 2015-07-26 ENCOUNTER — Encounter (HOSPITAL_COMMUNITY): Payer: Self-pay

## 2015-09-08 ENCOUNTER — Encounter (HOSPITAL_BASED_OUTPATIENT_CLINIC_OR_DEPARTMENT_OTHER): Payer: Self-pay | Admitting: Emergency Medicine

## 2015-09-08 ENCOUNTER — Emergency Department (HOSPITAL_BASED_OUTPATIENT_CLINIC_OR_DEPARTMENT_OTHER)
Admission: EM | Admit: 2015-09-08 | Discharge: 2015-09-09 | Disposition: A | Discharge status: Home / Self Care | Payer: Medicare Other | Attending: Emergency Medicine | Admitting: Emergency Medicine

## 2015-09-08 DIAGNOSIS — Z79899 Other long term (current) drug therapy: Secondary | ICD-10-CM | POA: Diagnosis not present

## 2015-09-08 DIAGNOSIS — R51 Headache: Secondary | ICD-10-CM | POA: Diagnosis not present

## 2015-09-08 DIAGNOSIS — Z791 Long term (current) use of non-steroidal anti-inflammatories (NSAID): Secondary | ICD-10-CM | POA: Insufficient documentation

## 2015-09-08 DIAGNOSIS — Z8669 Personal history of other diseases of the nervous system and sense organs: Secondary | ICD-10-CM | POA: Insufficient documentation

## 2015-09-08 DIAGNOSIS — K0889 Other specified disorders of teeth and supporting structures: Secondary | ICD-10-CM | POA: Insufficient documentation

## 2015-09-08 DIAGNOSIS — R519 Headache, unspecified: Secondary | ICD-10-CM

## 2015-09-08 HISTORY — DX: Bell's palsy: G51.0

## 2015-09-08 MED ORDER — TRAMADOL HCL 50 MG PO TABS
50.0000 mg | ORAL_TABLET | Freq: Once | ORAL | Status: AC
  Administered 2015-09-09: 50 mg via ORAL
  Filled 2015-09-08: qty 1

## 2015-09-08 MED ORDER — PENICILLIN V POTASSIUM 250 MG PO TABS
500.0000 mg | ORAL_TABLET | Freq: Once | ORAL | Status: AC
  Administered 2015-09-09: 500 mg via ORAL
  Filled 2015-09-08: qty 2

## 2015-09-08 NOTE — ED Notes (Signed)
Pt c/o cramping on right sided of face. Pt had Bell's Palsey affecting left side of face x 1 month ago

## 2015-09-08 NOTE — ED Notes (Signed)
Alert, NAD, calm, interactive, speaks spanish, family translating.

## 2015-09-08 NOTE — ED Provider Notes (Signed)
By signing my name below, I, Budd Palmer, attest that this documentation has been prepared under the direction and in the presence of [employer], DO. Electronically Signed: Budd Palmer, ED Scribe. 09/09/2015. 12:08 AM.   TIME SEEN: 11:31 PM   CHIEF COMPLAINT: Right-sided facial pain   HPI: Virginia King is a 49 y.o. female with a PMHx of left-sided Bell's Palsy (1 month ago, still recuperating) who presents to the Emergency Department complaining of intermittent, worsening, cramping, lower right-sided (from the cheekbone down) facial pain onset 2 weeks ago. No severe sharp pain. No pain over the temple. She reports associated numbness on the right side of the tongue (onset today), facial swelling, right upper dental and gum pain and a cramp in the right jaw. She notes exacerbation of the pain with lying down, eating, talking and stress. She has tried taking 500 mg acetaminophen with no relief. Pt denies recent injury to the face. She denies any other medical issues. Pt denies HA, fever, focal weakness, visual disturbances, rhinorrhea, cough, sore throat, ear pain, tinnitus, hearing loss.   Patient still has some mild left facial paralysis from her previous Bell's palsy.  ROS: See HPI Constitutional: no fever  Eyes: no drainage  ENT: no runny nose   Cardiovascular:  no chest pain  Resp: no SOB  GI: no vomiting GU: no dysuria Integumentary: no rash  Allergy: no hives  Musculoskeletal: no leg swelling  Neurological: no slurred speech ROS otherwise negative  PAST MEDICAL HISTORY/PAST SURGICAL HISTORY:  Past Medical History  Diagnosis Date  . Bell's palsy     MEDICATIONS:  Prior to Admission medications   Medication Sig Start Date End Date Taking? Authorizing Provider  cyclobenzaprine (FLEXERIL) 10 MG tablet Take 1 tablet (10 mg total) by mouth 2 (two) times daily as needed for muscle spasms. 03/27/15   Gwyneth Sprout, MD  naproxen (NAPROSYN) 500 MG tablet Take 1  tablet (500 mg total) by mouth 2 (two) times daily. 03/27/15   Gwyneth Sprout, MD  naproxen (NAPROSYN) 500 MG tablet Take 1 tablet (500 mg total) by mouth 2 (two) times daily as needed for moderate pain or headache. 07/18/15   Pricilla Loveless, MD  paliperidone (INVEGA) 9 MG 24 hr tablet Take 18 mg by mouth daily.    Historical Provider, MD  predniSONE (DELTASONE) 20 MG tablet Take 60 mg by mouth daily with breakfast.    Historical Provider, MD  QUEtiapine (SEROQUEL XR) 200 MG 24 hr tablet Take 600 mg by mouth at bedtime.    Historical Provider, MD  sertraline (ZOLOFT) 100 MG tablet Take 200 mg by mouth daily.    Historical Provider, MD  traZODone (DESYREL) 100 MG tablet Take 100 mg by mouth at bedtime as needed for sleep.    Historical Provider, MD  valACYclovir (VALTREX) 1000 MG tablet Take 1,000 mg by mouth 3 (three) times daily.    Historical Provider, MD  zolpidem (AMBIEN) 10 MG tablet Take 10 mg by mouth at bedtime as needed for sleep.    Historical Provider, MD    ALLERGIES:  No Known Allergies  SOCIAL HISTORY:  Social History  Substance Use Topics  . Smoking status: Never Smoker   . Smokeless tobacco: Not on file  . Alcohol Use: No    FAMILY HISTORY: No family history on file.  EXAM: BP 135/78 mmHg  Pulse 71  Temp(Src) 98.4 F (36.9 C) (Oral)  Resp 18  SpO2 100% CONSTITUTIONAL: Alert and oriented and responds appropriately to  questions. Well-appearing; well-nourished, afebrile, appears comfortable HEAD: Normocephalic EYES: Conjunctivae clear, PERRL, extraocular movements intact ENT: normal nose; no rhinorrhea; moist mucous membranes; pharynx without lesions noted, TMs clear without bulging or purulence, no cerumen impaction, no tenderness over the bilateral temples, patient is tender to palpation over the lower right face below the zygomatic arch without associated erythema, warmth, fluctuance or induration; no associated facial swelling noted on exam, no submandibular  swelling, tongue sits flat in the bottom of the mouth, normal phonation, no stridor, no trismus or drooling, patient has multiple previous cavities with fillings, she is tender to palpation over the gumline of the right upper teeth diffusely without any obvious abscess or swelling, no dental fracture or loose teeth noted, no pain with palpation of the teeth, patient does have some asymmetry in her smile on the left side from her previous Bell's palsy, no signs of mastoiditis NECK: Supple, no meningismus, no LAD  CARD: RRR; S1 and S2 appreciated; no murmurs, no clicks, no rubs, no gallops RESP: Normal chest excursion without splinting or tachypnea; breath sounds clear and equal bilaterally; no wheezes, no rhonchi, no rales, no hypoxia or respiratory distress, speaking full sentences ABD/GI: Normal bowel sounds; non-distended; soft, non-tender, no rebound, no guarding, no peritoneal signs BACK:  The back appears normal and is non-tender to palpation, there is no CVA tenderness EXT: Normal ROM in all joints; non-tender to palpation; no edema; normal capillary refill; no cyanosis, no calf tenderness or swelling    SKIN: Normal color for age and race; warm NEURO: Moves all extremities equally, sensation to light touch intact diffusely, cranial nerves II through XII intact other than slightly asymmetric smile noted on the left side from her previous Bell's palsy PSYCH: The patient's mood and manner are appropriate. Grooming and personal hygiene are appropriate.  MEDICAL DECISION MAKING: Patient here with right facial and right dental pain. No obvious sign of abscess on exam. No sign of facial cellulitis. She did recently have Bell's palsy on the left side. No other sign of neurologic deficit other than mild facial asymmetry from previous diagnosis that she reports is improving. She denies any headache. She does not have a sharp, severe or lancing pain. No vision changes. No jaw claudication. No history of any  autoimmune disease. I do not think this is temporal arteritis or trigeminal neuralgia. She denies any headache. No other focal neurologic deficits on exam. Pain is localized to the right cheek and right upper gum line. Discussed with patient that this may be dental in nature and will discharge her on penicillin prophylactically and with tramadol for pain control. She does have a primary care physician as well as a dentist and I recommended close follow-up. Discussed return precautions. She verbalized understanding and is comfortable with this plan. I do not feel she needs further emergent workup at this time.  I personally performed the services described in this documentation, which was scribed in my presence. The recorded information has been reviewed and is accurate.   Layla Maw Mekaylah Klich, DO 09/09/15 [id number]

## 2015-09-08 NOTE — ED Notes (Signed)
Dr. Elesa Massed into see pt.

## 2015-09-09 DIAGNOSIS — R51 Headache: Secondary | ICD-10-CM | POA: Diagnosis not present

## 2015-09-09 MED ORDER — TRAMADOL HCL 50 MG PO TABS: 50.0000 mg | ORAL_TABLET | Freq: Four times a day (QID) | ORAL | Status: DC | PRN

## 2015-09-09 MED ORDER — PENICILLIN V POTASSIUM 500 MG PO TABS: 500.0000 mg | ORAL_TABLET | Freq: Four times a day (QID) | ORAL | Status: DC

## 2015-09-09 NOTE — Discharge Instructions (Signed)
Dolor dental  (Dental Pain)  Las causas del dolor dental pueden ser muchas, entre ellas, las siguientes:  · Caries. La caries hace que el nervio del diente esté expuesto al aire y a las temperaturas calientes o frías. Esto puede causar dolor o molestias.  · Infección o absceso. Un absceso dental es un área llena de pus infectado debido a una infección bacteriana en la parte interna del diente (pulpa). Por lo general, se produce en el extremo de la raíz del diente.  · Lesiones.  · Una razón desconocida (idiopática).  El dolor puede ser leve o intenso. El dolor quizás aparezca solamente en estas ocasiones:  · Al masticar.  · Al estar expuesto a una temperatura caliente o fría.  · Al comer alimentos o tomar bebidas con azúcar, por ejemplo, gaseosas o caramelos.  El dolor también puede ser constante.  INSTRUCCIONES PARA EL CUIDADO EN EL HOGAR  Controle el dolor dental a fin de detectar algún cambio. Las siguientes medidas pueden servir para aliviar cualquier molestia que esté sintiendo:  · Tome los medicamentos solamente como se lo haya indicado el dentista.  · Si le recetaron antibióticos, asegúrese de terminarlos, incluso si comienza a sentirse mejor.  · Concurra a todas las visitas de control como se lo haya indicado el dentista. Esto es importante.  · No se aplique calor en la parte externa de la cara.  · Si el dentista se lo ha indicado, enjuáguese la boca o haga gárgaras con agua salada. Esto ayuda a aliviar el dolor y reducir la hinchazón.    Puede preparar agua salada agregando ¼ de cucharadita de sal en 1 taza de agua templada.  · Aplíquese hielo sobre la zona dolorida de la cara:    Ponga el hielo en una bolsa plástica.    Coloque una toalla entre la piel y la bolsa de hielo.    Coloque el hielo durante 20 minutos, 2 a 3 veces por día.  · Evite los alimentos o las bebidas que le causen dolor, como los siguientes:    Alimentos o bebidas muy calientes o muy fríos.    Alimentos o bebidas dulces o con  azúcar.  SOLICITE ATENCIÓN MÉDICA SI:  · El dolor no se alivia con los medicamentos.  · Los síntomas empeoran.  · Aparecen nuevos síntomas.  SOLICITE ATENCIÓN MÉDICA DE INMEDIATO SI:  · No puede abrir la boca.  · Tiene problemas para respirar o tragar.  · Tiene fiebre.  · Tiene hinchazón en la cara, el cuello o la mandíbula.     Esta información no tiene como fin reemplazar el consejo del médico. Asegúrese de hacerle al médico cualquier pregunta que tenga.     Document Released: 11/16/2005 Document Revised: 04/02/2015  [employer] Interactive Patient Education ©2016 [employer]

## 2016-12-15 ENCOUNTER — Emergency Department (HOSPITAL_BASED_OUTPATIENT_CLINIC_OR_DEPARTMENT_OTHER): Payer: Medicare Other

## 2016-12-15 ENCOUNTER — Encounter (HOSPITAL_BASED_OUTPATIENT_CLINIC_OR_DEPARTMENT_OTHER): Payer: Self-pay | Admitting: *Deleted

## 2016-12-15 ENCOUNTER — Emergency Department (HOSPITAL_BASED_OUTPATIENT_CLINIC_OR_DEPARTMENT_OTHER)
Admission: EM | Admit: 2016-12-15 | Discharge: 2016-12-15 | Disposition: A | Discharge status: Home / Self Care | Payer: Medicare Other | Attending: Emergency Medicine | Admitting: Emergency Medicine

## 2016-12-15 DIAGNOSIS — M25552 Pain in left hip: Secondary | ICD-10-CM | POA: Diagnosis not present

## 2016-12-15 MED ORDER — PREDNISONE 10 MG PO TABS: 20.0000 mg | ORAL_TABLET | Freq: Every day | ORAL | 0 refills | Status: DC

## 2016-12-15 MED ORDER — ONDANSETRON 4 MG PO TBDP
4.0000 mg | ORAL_TABLET | Freq: Once | ORAL | Status: AC
  Administered 2016-12-15: 4 mg via ORAL
  Filled 2016-12-15: qty 1

## 2016-12-15 MED ORDER — HYDROCODONE-ACETAMINOPHEN 5-325 MG PO TABS: 2.0000 | ORAL_TABLET | ORAL | 0 refills | Status: DC | PRN

## 2016-12-15 MED ORDER — HYDROCODONE-ACETAMINOPHEN 5-325 MG PO TABS
1.0000 | ORAL_TABLET | Freq: Once | ORAL | Status: AC
  Administered 2016-12-15: 1 via ORAL
  Filled 2016-12-15: qty 1

## 2016-12-15 MED ORDER — KETOROLAC TROMETHAMINE 60 MG/2ML IM SOLN
60.0000 mg | Freq: Once | INTRAMUSCULAR | Status: AC
  Administered 2016-12-15: 60 mg via INTRAMUSCULAR
  Filled 2016-12-15: qty 2

## 2016-12-15 NOTE — ED Provider Notes (Signed)
[facility] Provider Note   CSN: [id number] Arrival date & time: 12/15/16  1207     History   Chief Complaint Chief Complaint  Patient presents with  . Hip Pain    HPI Virginia King is a 51 y.o. female.  HPI   [name] is a 51 yo female who presents to the ER complaining of left hip pain.  She reports a fall in her bathroom at home 2 months ago and began having pain in her left hip at that time.  This pain subsided but returned 1 month ago and has significantly worsened over the past 2 days.  She reports that she cannot sleep on the affected side because of the pain and is able to put weight on it but cannot walk without severe pain.  Twisting at the waist also exacerbates her pain.  The pain radiates into her left lower back.  She has tried OTC meds for pain control without relief.   The patient states that she has an appointment scheduled with her PCP next month for this issue but was unable to wait any longer.  She denies any trauma to the hip since her initial fall.  She denies saddle anesthesia, loss of bowel or bladder control, lower extremity numbness or tingling or weakness.  Past Medical History:  Diagnosis Date  . Bell's palsy     There are no active problems to display for this patient.   Past Surgical History:  Procedure Laterality Date  . ABDOMINAL HYSTERECTOMY    . VAGINA SURGERY      OB History    Gravida Para Term Preterm AB Living   0 0 0 0 0     SAB TAB Ectopic Multiple Live Births   0 0 0           Home Medications    Prior to Admission medications   Medication Sig Start Date End Date Taking? Authorizing Provider  paliperidone (INVEGA) 9 MG 24 hr tablet Take 18 mg by mouth daily.   Yes Historical Provider, MD  sertraline (ZOLOFT) 100 MG tablet Take 200 mg by mouth daily.   Yes Historical Provider, MD  cyclobenzaprine (FLEXERIL) 10 MG tablet Take 1 tablet (10 mg total) by mouth 2 (two) times daily as needed for  muscle spasms. 03/27/15   Gwyneth Sprout, MD  HYDROcodone-acetaminophen (NORCO/VICODIN) 5-325 MG tablet Take 2 tablets by mouth every 4 (four) hours as needed. 12/15/16   Glenna Brunkow Neva Seat, PA-C  naproxen (NAPROSYN) 500 MG tablet Take 1 tablet (500 mg total) by mouth 2 (two) times daily. 03/27/15   Gwyneth Sprout, MD  naproxen (NAPROSYN) 500 MG tablet Take 1 tablet (500 mg total) by mouth 2 (two) times daily as needed for moderate pain or headache. 07/18/15   Pricilla Loveless, MD  penicillin v potassium (VEETID) 500 MG tablet Take 1 tablet (500 mg total) by mouth 4 (four) times daily. 09/09/15   Kristen N Ward, DO  predniSONE (DELTASONE) 10 MG tablet Take 2 tablets (20 mg total) by mouth daily. Prednisone dose pack directions:   5 tabs on day one, 4 tabs on day two, 3 tabs on day three, 2 tabs on day four, 1 tab on day five. Disp # [address]  Marlon Pel, PA-C 12/15/16   Marlon Pel, PA-C  predniSONE (DELTASONE) 20 MG tablet Take 60 mg by mouth daily with breakfast.    Historical Provider, MD  QUEtiapine (SEROQUEL XR) 200 MG 24 hr tablet Take 600  mg by mouth at bedtime.    Historical Provider, MD  traMADol (ULTRAM) 50 MG tablet Take 1 tablet (50 mg total) by mouth every 6 (six) hours as needed. 09/09/15   Kristen N Ward, DO  traZODone (DESYREL) 100 MG tablet Take 100 mg by mouth at bedtime as needed for sleep.    Historical Provider, MD  valACYclovir (VALTREX) 1000 MG tablet Take 1,000 mg by mouth 3 (three) times daily.    Historical Provider, MD  zolpidem (AMBIEN) 10 MG tablet Take 10 mg by mouth at bedtime as needed for sleep.    Historical Provider, MD    Family History No family history on file.  Social History Social History  Substance Use Topics  . Smoking status: Never Smoker  . Smokeless tobacco: Never Used  . Alcohol use No     Allergies   Patient has no known allergies.   Review of Systems Review of Systems Review of Systems All other systems negative except as documented in  the HPI. All pertinent positives and negatives as reviewed in the HPI.   Physical Exam Updated Vital Signs BP 118/80   Pulse 71   Temp 97.9 F (36.6 C) (Oral)   Resp 20   Ht 5\' 3"  (1.6 m)   Wt 92.1 kg   SpO2 100%   BMI 35.96 kg/m   Physical Exam  Constitutional: She appears well-developed and well-nourished. No distress.  HENT:  Head: Normocephalic and atraumatic.  Eyes: Pupils are equal, round, and reactive to light.  Neck: Normal range of motion. Neck supple.  Cardiovascular: Normal rate and regular rhythm.   Pulmonary/Chest: Effort normal.  Abdominal: Soft.  Musculoskeletal:  5/5 strength at the ankle, knee and hip bilaterally.  Internal and external rotation of hip normal bilaterally.  Significant pain with FADIR and FABER tests in left hip. Active ROM of left hip limited by pain when compared with right.  Negative SLR bilaterally.  Significant pain with palpation of left greater trochanter.  Neurological: She is alert.  Sensation intact and equal bilaterally from feet to level of the waist.   2+ DP and PT pulses bilaterally  Skin: Skin is warm and dry.  no ecchymosis or obvious deformity externally  Nursing note and vitals reviewed.    ED Treatments / Results  Labs (all labs ordered are listed, but only abnormal results are displayed) Labs Reviewed - No data to display  EKG  EKG Interpretation None       Radiology Dg Hip Unilat W Or Wo Pelvis 2-3 Views Left  Result Date: 12/15/2016 CLINICAL DATA:  Left hip pain for 1 month, worsened over the past 2 days. Initial encounter. EXAM: DG HIP (WITH OR WITHOUT PELVIS) 2-3V LEFT COMPARISON:  None. FINDINGS: There is no evidence of hip fracture or dislocation. There is no evidence of arthropathy or other focal bone abnormality. IMPRESSION: Negative exam. Electronically Signed   By: Drusilla Kanner M.D.   On: 12/15/2016 13:07    Procedures Procedures (including critical care time)  Medications Ordered in  ED Medications  ketorolac (TORADOL) injection 60 mg (60 mg Intramuscular Given 12/15/16 1347)  HYDROcodone-acetaminophen (NORCO/VICODIN) 5-325 MG per tablet 1 tablet (1 tablet Oral Given 12/15/16 1347)  ondansetron (ZOFRAN-ODT) disintegrating tablet 4 mg (4 mg Oral Given 12/15/16 1347)     Initial Impression / Assessment and Plan / ED Course  I have reviewed the triage vital signs and the nursing notes.  Pertinent labs & imaging results that were available during my  care of the patient were reviewed by me and considered in my medical decision making (see chart for details).  Clinical Course    Xray of hip is negative for any fracture, dislocation, arthropathy or focal bone abnormality.  Patient states she is not able to ambulate without significant pain at this time.  Toradol given for pain control and patient was able to ambulate with antalgic gait after this.  Pt is weight-bearing.   Final Clinical Impressions(s) / ED Diagnoses   Final diagnoses:  Left hip pain   Suspect L trochanteric bursitis.  Pain control with Percocet 5/325 (3 day supply).  Medrol dose pack. Follow up with PCP.  Referral to ortho.  New Prescriptions New Prescriptions   HYDROCODONE-ACETAMINOPHEN (NORCO/VICODIN) 5-325 MG TABLET    Take 2 tablets by mouth every 4 (four) hours as needed.   PREDNISONE (DELTASONE) 10 MG TABLET    Take 2 tablets (20 mg total) by mouth daily. Prednisone dose pack directions:   5 tabs on day one, 4 tabs on day two, 3 tabs on day three, 2 tabs on day four, 1 tab on day five. Disp # [id number]  Maejor Erven, PA-C     Marlon Peliffany Geraldyne Barraclough, PA-C 12/15/16 1531    Laurence Spatesachel Morgan Little, MD 12/16/16 [phone]

## 2016-12-15 NOTE — ED Triage Notes (Signed)
Left hip pain for a month. This week the pain has been to much to stand. She has an appointment with her MD but not until next month per pt.

## 2017-02-02 ENCOUNTER — Observation Stay (HOSPITAL_BASED_OUTPATIENT_CLINIC_OR_DEPARTMENT_OTHER)
Admission: EM | Admit: 2017-02-02 | Discharge: 2017-02-03 | Disposition: A | Discharge status: Home / Self Care | Payer: Medicare Other | Attending: Internal Medicine | Admitting: Internal Medicine

## 2017-02-02 ENCOUNTER — Observation Stay (HOSPITAL_COMMUNITY): Payer: Medicare Other

## 2017-02-02 ENCOUNTER — Emergency Department (HOSPITAL_BASED_OUTPATIENT_CLINIC_OR_DEPARTMENT_OTHER): Payer: Medicare Other

## 2017-02-02 ENCOUNTER — Encounter (HOSPITAL_BASED_OUTPATIENT_CLINIC_OR_DEPARTMENT_OTHER): Payer: Self-pay | Admitting: Emergency Medicine

## 2017-02-02 DIAGNOSIS — K8500 Idiopathic acute pancreatitis without necrosis or infection: Secondary | ICD-10-CM | POA: Diagnosis not present

## 2017-02-02 DIAGNOSIS — F32A Depression, unspecified: Secondary | ICD-10-CM | POA: Diagnosis present

## 2017-02-02 DIAGNOSIS — Z79899 Other long term (current) drug therapy: Secondary | ICD-10-CM | POA: Diagnosis not present

## 2017-02-02 DIAGNOSIS — Z6836 Body mass index (BMI) 36.0-36.9, adult: Secondary | ICD-10-CM | POA: Insufficient documentation

## 2017-02-02 DIAGNOSIS — E6609 Other obesity due to excess calories: Secondary | ICD-10-CM | POA: Insufficient documentation

## 2017-02-02 DIAGNOSIS — R1011 Right upper quadrant pain: Secondary | ICD-10-CM | POA: Diagnosis present

## 2017-02-02 DIAGNOSIS — K859 Acute pancreatitis without necrosis or infection, unspecified: Secondary | ICD-10-CM

## 2017-02-02 DIAGNOSIS — F329 Major depressive disorder, single episode, unspecified: Secondary | ICD-10-CM | POA: Diagnosis present

## 2017-02-02 HISTORY — DX: Major depressive disorder, single episode, unspecified: F32.9

## 2017-02-02 HISTORY — DX: Depression, unspecified: F32.A

## 2017-02-02 LAB — CBC WITH DIFFERENTIAL/PLATELET
BASOS ABS: 0 10*3/uL (ref 0.0–0.1)
Basophils Relative: 0 %
Eosinophils Absolute: 0.1 10*3/uL (ref 0.0–0.7)
Eosinophils Relative: 1 %
HEMATOCRIT: 34.9 % — AB (ref 36.0–46.0)
HEMOGLOBIN: 11.5 g/dL — AB (ref 12.0–15.0)
LYMPHS PCT: 26 %
Lymphs Abs: 2.3 10*3/uL (ref 0.7–4.0)
MCH: 29.4 pg (ref 26.0–34.0)
MCHC: 33 g/dL (ref 30.0–36.0)
MCV: 89.3 fL (ref 78.0–100.0)
MONOS PCT: 9 %
Monocytes Absolute: 0.8 10*3/uL (ref 0.1–1.0)
NEUTROS ABS: 5.7 10*3/uL (ref 1.7–7.7)
Neutrophils Relative %: 64 %
Platelets: 301 10*3/uL (ref 150–400)
RBC: 3.91 MIL/uL (ref 3.87–5.11)
RDW: 13.3 % (ref 11.5–15.5)
WBC: 9 10*3/uL (ref 4.0–10.5)

## 2017-02-02 LAB — CREATININE, SERUM
CREATININE: 0.64 mg/dL (ref 0.44–1.00)
GFR calc Af Amer: >60 mL/min (ref >60)
GFR calc non Af Amer: >60 mL/min (ref >60)

## 2017-02-02 LAB — LIPID PANEL
CHOL/HDL RATIO: 4.9 ratio
CHOLESTEROL: 194 mg/dL (ref 0–200)
CHOLESTEROL: 191 mg/dL (ref 0–200)
HDL: 49 mg/dL (ref >40)
HDL: 39 mg/dL — AB (ref >40)
LDL Cholesterol: 115 mg/dL — ABNORMAL HIGH (ref 0–99)
LDL Cholesterol: 125 mg/dL — ABNORMAL HIGH (ref 0–99)
TRIGLYCERIDES: 149 mg/dL (ref <150)
TRIGLYCERIDES: 135 mg/dL (ref <150)
Total CHOL/HDL Ratio: 4 RATIO
VLDL: 30 mg/dL (ref 0–40)
VLDL: 27 mg/dL (ref 0–40)

## 2017-02-02 LAB — CBC
HCT: 34.2 % — ABNORMAL LOW (ref 36.0–46.0)
HEMOGLOBIN: 11.5 g/dL — AB (ref 12.0–15.0)
MCH: 29.6 pg (ref 26.0–34.0)
MCHC: 33.6 g/dL (ref 30.0–36.0)
MCV: 88.1 fL (ref 78.0–100.0)
Platelets: 253 10*3/uL (ref 150–400)
RBC: 3.88 MIL/uL (ref 3.87–5.11)
RDW: 13.8 % (ref 11.5–15.5)
WBC: 6.1 10*3/uL (ref 4.0–10.5)

## 2017-02-02 LAB — COMPREHENSIVE METABOLIC PANEL
ALBUMIN: 3.8 g/dL (ref 3.5–5.0)
ALK PHOS: 71 U/L (ref 38–126)
ALT: 12 U/L — ABNORMAL LOW (ref 14–54)
AST: 21 U/L (ref 15–41)
Anion gap: 6 (ref 5–15)
BILIRUBIN TOTAL: 0.5 mg/dL (ref 0.3–1.2)
BUN: 13 mg/dL (ref 6–20)
CALCIUM: 9 mg/dL (ref 8.9–10.3)
CO2: 28 mmol/L (ref 22–32)
Chloride: 103 mmol/L (ref 101–111)
Creatinine, Ser: 0.69 mg/dL (ref 0.44–1.00)
GFR calc Af Amer: >60 mL/min (ref >60)
GLUCOSE: 105 mg/dL — AB (ref 65–99)
POTASSIUM: 4.4 mmol/L (ref 3.5–5.1)
Sodium: 137 mmol/L (ref 135–145)
TOTAL PROTEIN: 6.9 g/dL (ref 6.5–8.1)

## 2017-02-02 LAB — LIPASE, BLOOD: Lipase: 71 U/L — ABNORMAL HIGH (ref 11–51)

## 2017-02-02 LAB — MAGNESIUM: MAGNESIUM: 1.8 mg/dL (ref 1.7–2.4)

## 2017-02-02 LAB — ETHANOL: Alcohol, Ethyl (B): <5 mg/dL (ref <5)

## 2017-02-02 MED ORDER — ONDANSETRON HCL 4 MG PO TABS
4.0000 mg | ORAL_TABLET | Freq: Four times a day (QID) | ORAL | Status: DC | PRN
  Filled 2017-02-02: qty 1

## 2017-02-02 MED ORDER — OXYCODONE HCL 5 MG PO TABS
5.0000 mg | ORAL_TABLET | ORAL | Status: DC | PRN
  Administered 2017-02-02 – 2017-02-03 (×3): 5 mg via ORAL
  Filled 2017-02-02 (×3): qty 1

## 2017-02-02 MED ORDER — SODIUM CHLORIDE 0.9 % IV SOLN
INTRAVENOUS | Status: DC
  Administered 2017-02-02 – 2017-02-03 (×4): via INTRAVENOUS

## 2017-02-02 MED ORDER — ONDANSETRON HCL 4 MG/2ML IJ SOLN
4.0000 mg | Freq: Four times a day (QID) | INTRAMUSCULAR | Status: DC | PRN
  Filled 2017-02-02: qty 2

## 2017-02-02 MED ORDER — FENTANYL CITRATE (PF) 100 MCG/2ML IJ SOLN
100.0000 ug | Freq: Once | INTRAMUSCULAR | Status: AC
  Administered 2017-02-02: 100 ug via INTRAVENOUS
  Filled 2017-02-02: qty 2

## 2017-02-02 MED ORDER — IOPAMIDOL (ISOVUE-300) INJECTION 61%
100.0000 mL | Freq: Once | INTRAVENOUS | Status: AC | PRN
  Administered 2017-02-02: 100 mL via INTRAVENOUS

## 2017-02-02 MED ORDER — ENOXAPARIN SODIUM 40 MG/0.4ML ~~LOC~~ SOLN
40.0000 mg | SUBCUTANEOUS | Status: DC
  Administered 2017-02-03: 40 mg via SUBCUTANEOUS
  Filled 2017-02-02 (×2): qty 0.4

## 2017-02-02 MED ORDER — MORPHINE SULFATE (PF) 4 MG/ML IV SOLN
2.0000 mg | INTRAVENOUS | Status: DC | PRN
  Administered 2017-02-02 (×2): 2 mg via INTRAVENOUS
  Filled 2017-02-02: qty 1
  Filled 2017-02-02: qty 0.5
  Filled 2017-02-02: qty 1

## 2017-02-02 MED ORDER — MORPHINE SULFATE (PF) 2 MG/ML IV SOLN
2.0000 mg | INTRAVENOUS | Status: DC | PRN
  Administered 2017-02-02: 2 mg via INTRAVENOUS
  Filled 2017-02-02: qty 1

## 2017-02-02 MED ORDER — ONDANSETRON HCL 4 MG/2ML IJ SOLN
4.0000 mg | Freq: Three times a day (TID) | INTRAMUSCULAR | Status: AC | PRN
  Administered 2017-02-02: 4 mg via INTRAVENOUS
  Filled 2017-02-02 (×2): qty 2

## 2017-02-02 MED ORDER — ONDANSETRON HCL 4 MG/2ML IJ SOLN
4.0000 mg | Freq: Once | INTRAMUSCULAR | Status: AC
  Administered 2017-02-02: 4 mg via INTRAVENOUS
  Filled 2017-02-02: qty 2

## 2017-02-02 MED ORDER — SUCRALFATE 1 G PO TABS
1.0000 g | ORAL_TABLET | Freq: Once | ORAL | Status: AC
  Administered 2017-02-02: 1 g via ORAL
  Filled 2017-02-02: qty 1

## 2017-02-02 MED ORDER — LEVALBUTEROL HCL 0.63 MG/3ML IN NEBU
0.6300 mg | INHALATION_SOLUTION | Freq: Four times a day (QID) | RESPIRATORY_TRACT | Status: DC | PRN
  Filled 2017-02-02: qty 3

## 2017-02-02 MED ORDER — FENTANYL CITRATE (PF) 100 MCG/2ML IJ SOLN: 50.0000 ug | INTRAMUSCULAR | Status: DC | PRN

## 2017-02-02 MED ORDER — PANTOPRAZOLE SODIUM 40 MG IV SOLR
40.0000 mg | Freq: Once | INTRAVENOUS | Status: AC
Start: 2017-02-02 — End: 2017-02-02
  Administered 2017-02-02: 40 mg via INTRAVENOUS
  Filled 2017-02-02: qty 40

## 2017-02-02 MED ORDER — SODIUM CHLORIDE 0.9 % IV SOLN
INTRAVENOUS | Status: AC
  Administered 2017-02-02: 05:00:00 via INTRAVENOUS

## 2017-02-02 NOTE — ED Notes (Signed)
[facility].

## 2017-02-02 NOTE — Progress Notes (Signed)
MEDICATION RELATED CONSULT NOTE - INITIAL   Pharmacy Consult for medications that can cause pancreatitis  Assessment: Per uptodate: Pancreatitis due to medications is rare (0.3 to 1.4 percent), although limited data suggest that the incidence may be increasing.. Drug-induced pancreatitis is classified (class I-IV) based on the number of cases reported, demonstration of a consistent latency period (time from initiation of drug to development of pancreatitis), and reaction with rechallenge. Class I and II drugs have the greatest potential for causing acute pancreatitis.   Pt takes naproxen and prednisone (both class III) and sertraline (class IV)  Pharmacy will sign off, please re-consult if needed  Arley Phenixllen Chrishun Scheer RPh 02/02/2017, 10:09 AM Pager [phone]

## 2017-02-02 NOTE — Progress Notes (Signed)
This is a no charge note  [facility].  51 year old lady with past medical history for depression who presents with right upper quadrant abdominal pain for 3 days. No nausea, vomiting or diarrhea per ED physician. Found to have lipase 71. CT abdomen/pelvis showed acute pancreatitis. Currently hemodynamically stable. Patient is accepted to med-surg bed for obs.  WBC 9.0, electrolytes renal function okay, lipase 71, temperature normal, no tachycardia, O2 saturation 100% on room air.  Lorretta HarpXilin Roderica Cathell, MD  [facility] Pager [phone]  If 7PM-7AM, please contact night-coverage [email or web address] Password [email or web address] 02/02/2017, 4:26 AM

## 2017-02-02 NOTE — Progress Notes (Signed)
[facility] admit MD called for admission orders and patient arrival.

## 2017-02-02 NOTE — ED Notes (Signed)
Report given to [place] RN on [facility] observation.

## 2017-02-02 NOTE — H&P (Addendum)
[facility] History and Physical  Virginia King ZOX:[id number] DOB: January 20, 1966 DOA: 02/02/2017  Referring physician:   PCP: Pcp Not In System   Chief Complaint: epigastric pain  HPI:   51 y.o. female with history of Bell's palsy, depression, rosacea,, presents with a   three-day history of epigastric abdominal pain. Pain increases after eating. Radiates into the right upper quadrant. Also felt centrally and on the left upper quadrant. Symptoms Started spontaneously . No prior history of pancreatitis. She has not  been started on any new medications. Denies drinking alcohol. Patient's daughter who acts as an interpreter states that patient has actually stopped all her medications for the last 1 month, as she is trying to wean off of them. She denies associated nausea, vomiting, diarrhea, fever or chills.  ED course Blood pressure 124/88, pulse 72, temperature 98.1 F (36.7 C), temperature source Oral, resp. rate 20, height 5\' 3"  (1.6 m), weight 205 lb (93 kg), SpO2 100 %. CT abdomen pelvis shows acute pancreatitis in the head of the pancreas. She status post cholecystectomy. Lipase 71. White count 9.0. Lipid panel shows triglycerides 149    Review of Systems: negative for the following  Constitutional: Denies fever, chills, diaphoresis, appetite change and fatigue.  HEENT: Denies photophobia, eye pain, redness, hearing loss, ear pain, congestion, sore throat, rhinorrhea, sneezing, mouth sores, trouble swallowing, neck pain, neck stiffness and tinnitus.  Respiratory: Denies SOB, DOE, cough, chest tightness, and wheezing.  Cardiovascular: Denies chest pain, palpitations and leg swelling.  Gastrointestinal: Positive for nausea, vomiting, abdominal pain, denies diarrhea, constipation, blood in stool and abdominal distention.  Genitourinary: Denies dysuria, urgency, frequency, hematuria, flank pain and difficulty urinating.  Musculoskeletal: Denies myalgias, back pain, joint  swelling, arthralgias and gait problem.  Skin: Denies pallor, rash and wound.  Neurological: Denies dizziness, seizures, syncope, weakness, light-headedness, numbness and headaches.  Hematological: Denies adenopathy. Easy bruising, personal or family bleeding history  Psychiatric/Behavioral: Denies suicidal ideation, mood changes, confusion, nervousness, sleep disturbance and agitation       Past Medical History:  Diagnosis Date  . Bell's palsy   . Depression      Past Surgical History:  Procedure Laterality Date  . ABDOMINAL HYSTERECTOMY    . VAGINA SURGERY        Social History:  reports that she has never smoked. She has never used smokeless tobacco. She reports that she does not drink alcohol or use drugs.    No Known Allergies      FAMILY HISTORY  When questioned  Directly-patient reports  No family history of HTN, CVA ,DIABETES, TB, Cancer CAD, Bleeding Disorders, Sickle Cell, diabetes, anemia, asthma,   Prior to Admission medications   Medication Sig Start Date End Date Taking? Authorizing Provider  paliperidone (INVEGA) 9 MG 24 hr tablet Take 18 mg by mouth daily.    Historical Provider, MD  sertraline (ZOLOFT) 100 MG tablet Take 200 mg by mouth daily.    Historical Provider, MD  zolpidem (AMBIEN) 10 MG tablet Take 10 mg by mouth at bedtime as needed for sleep.    Historical Provider, MD     Physical Exam: Vitals:   02/02/17 0522 02/02/17 0729 02/02/17 0800 02/02/17 0922  BP: 108/63 109/78 111/81 (!) 100/56  Pulse: 62 62 65 (!) 59  Resp: 16 16  16   Temp:  97.5 F (36.4 C)  97.6 F (36.4 C)  TempSrc:  Oral  Oral  SpO2: 97% 95% 96% 96%  Weight:  Height:          Constitutional: NAD, calm, comfortable Vitals:   02/02/17 0522 02/02/17 0729 02/02/17 0800 02/02/17 0922  BP: 108/63 109/78 111/81 (!) 100/56  Pulse: 62 62 65 (!) 59  Resp: 16 16  16   Temp:  97.5 F (36.4 C)  97.6 F (36.4 C)  TempSrc:  Oral  Oral  SpO2: 97% 95% 96% 96%  Weight:       Height:       Eyes: PERRL, lids and conjunctivae normal ENMT: Mucous membranes are moist. Posterior pharynx clear of any exudate or lesions.Normal dentition.  Neck: normal, supple, no masses, no thyromegaly Respiratory: clear to auscultation bilaterally, no wheezing, no crackles. Normal respiratory effort. No accessory muscle use.  Cardiovascular: Regular rate and rhythm, no murmurs / rubs / gallops. No extremity edema. 2+ pedal pulses. No carotid bruits.  Abdomen: Epigastric tenderness, no masses palpated. No hepatosplenomegaly. Bowel sounds positive.  Musculoskeletal: no clubbing / cyanosis. No joint deformity upper and lower extremities. Good ROM, no contractures. Normal muscle tone.  Skin: no rashes, lesions, ulcers. No induration Neurologic: CN 2-12 grossly intact. Sensation intact, DTR normal. Strength 5/5 in all 4.  Psychiatric: Normal judgment and insight. Alert and oriented x 3. Normal mood.     Labs on Admission: I have personally reviewed following labs and imaging studies  CBC:  Recent Labs Lab 02/02/17 0143  WBC 9.0  NEUTROABS 5.7  HGB 11.5*  HCT 34.9*  MCV 89.3  PLT 301    Basic Metabolic Panel:  Recent Labs Lab 02/02/17 0143  NA 137  K 4.4  CL 103  CO2 28  GLUCOSE 105*  BUN 13  CREATININE 0.69  CALCIUM 9.0    GFR: Estimated Creatinine Clearance: 91.1 mL/min (by C-G formula based on SCr of 0.69 mg/dL).  Liver Function Tests:  Recent Labs Lab 02/02/17 0143  AST 21  ALT 12*  ALKPHOS 71  BILITOT 0.5  PROT 6.9  ALBUMIN 3.8    Recent Labs Lab 02/02/17 0143  LIPASE 71*   No results for input(s): AMMONIA in the last 168 hours.  Coagulation Profile: No results for input(s): INR, PROTIME in the last 168 hours. No results for input(s): DDIMER in the last 72 hours.  Cardiac Enzymes: No results for input(s): CKTOTAL, CKMB, CKMBINDEX, TROPONINI in the last 168 hours.  BNP (last 3 results) No results for input(s): PROBNP in the last  8760 hours.  HbA1C: No results for input(s): HGBA1C in the last 72 hours. No results found for: HGBA1C   CBG: No results for input(s): GLUCAP in the last 168 hours.  Lipid Profile:  Recent Labs  02/02/17 0143  CHOL 194  HDL 49  LDLCALC 115*  TRIG 149  CHOLHDL 4.0    Thyroid Function Tests: No results for input(s): TSH, T4TOTAL, FREET4, T3FREE, THYROIDAB in the last 72 hours.  Anemia Panel: No results for input(s): VITAMINB12, FOLATE, FERRITIN, TIBC, IRON, RETICCTPCT in the last 72 hours.  Urine analysis:    Component Value Date/Time   COLORURINE YELLOW 07/18/2015 0250   APPEARANCEUR CLEAR 07/18/2015 0250   LABSPEC 1.011 07/18/2015 0250   PHURINE 5.5 07/18/2015 0250   GLUCOSEU NEGATIVE 07/18/2015 0250   HGBUR TRACE (A) 07/18/2015 0250   BILIRUBINUR NEGATIVE 07/18/2015 0250   KETONESUR NEGATIVE 07/18/2015 0250   PROTEINUR NEGATIVE 07/18/2015 0250   UROBILINOGEN 0.2 07/18/2015 0250   NITRITE NEGATIVE 07/18/2015 0250   LEUKOCYTESUR TRACE (A) 07/18/2015 0250    Sepsis Labs: @LABRCNTIP (procalcitonin:4,lacticidven:4) )  No results found for this or any previous visit (from the past 240 hour(s)).       Radiological Exams on Admission: Ct Abdomen Pelvis W Contrast  Result Date: 02/02/2017 CLINICAL DATA:  Initial evaluation for acute right upper quadrant abdominal pain. EXAM: CT ABDOMEN AND PELVIS WITH CONTRAST TECHNIQUE: Multidetector CT imaging of the abdomen and pelvis was performed using the standard protocol following bolus administration of intravenous contrast. CONTRAST:  100mL ISOVUE-300 IOPAMIDOL (ISOVUE-300) INJECTION 61% COMPARISON:  None available. FINDINGS: Lower chest: Mild subsegmental atelectasis seen dependently within the visualized lung bases. Visualized lungs are otherwise clear. Hepatobiliary: Liver demonstrates a normal contrast enhanced appearance. Gallbladder appears to be absent. No biliary dilatation. Pancreas: Mild inflammatory changes present  about the pancreatic head, suggesting acute pancreatitis (series 2, image 42). No loculated collection or evidence for pancreatic necrosis. Relative sparing of the pancreatic body and tail. Spleen: Spleen within normal limits. Adrenals/Urinary Tract: Adrenal glands are normal. Kidneys equal in size with symmetric enhancement. No nephrolithiasis, hydronephrosis, or focal enhancing renal mass. No hydroureter. Bladder within normal limits. Stomach/Bowel: Stomach within normal limits. Mild hazy stranding about the duodenum related to the inflammatory changes within the adjacent pancreas. No evidence for bowel obstruction. Appendix is normal. No acute inflammatory changes seen about the bowels. Vascular/Lymphatic: Normal intravascular enhancement seen throughout the intra-abdominal aorta and its branch vessels. No pathologically enlarged intra-abdominal the or pelvic lymph nodes identified. Reproductive: Uterus appears to be absent. Ovaries within normal limits. Small 19 mm cyst noted within the right ovary. Other: No free intraperitoneal air.  No free fluid. Musculoskeletal: No acute osseous abnormality. No worrisome lytic or blastic osseous lesions. IMPRESSION: 1. Hazy inflammatory stranding about the pancreatic head, suggesting acute pancreatitis. No complication identified. 2. No other acute intra-abdominal or pelvic process identified. Electronically Signed   By: Rise MuBenjamin  McClintock M.D.   On: 02/02/2017 03:45   Ct Abdomen Pelvis W Contrast  Result Date: 02/02/2017 CLINICAL DATA:  Initial evaluation for acute right upper quadrant abdominal pain. EXAM: CT ABDOMEN AND PELVIS WITH CONTRAST TECHNIQUE: Multidetector CT imaging of the abdomen and pelvis was performed using the standard protocol following bolus administration of intravenous contrast. CONTRAST:  100mL ISOVUE-300 IOPAMIDOL (ISOVUE-300) INJECTION 61% COMPARISON:  None available. FINDINGS: Lower chest: Mild subsegmental atelectasis seen dependently within  the visualized lung bases. Visualized lungs are otherwise clear. Hepatobiliary: Liver demonstrates a normal contrast enhanced appearance. Gallbladder appears to be absent. No biliary dilatation. Pancreas: Mild inflammatory changes present about the pancreatic head, suggesting acute pancreatitis (series 2, image 42). No loculated collection or evidence for pancreatic necrosis. Relative sparing of the pancreatic body and tail. Spleen: Spleen within normal limits. Adrenals/Urinary Tract: Adrenal glands are normal. Kidneys equal in size with symmetric enhancement. No nephrolithiasis, hydronephrosis, or focal enhancing renal mass. No hydroureter. Bladder within normal limits. Stomach/Bowel: Stomach within normal limits. Mild hazy stranding about the duodenum related to the inflammatory changes within the adjacent pancreas. No evidence for bowel obstruction. Appendix is normal. No acute inflammatory changes seen about the bowels. Vascular/Lymphatic: Normal intravascular enhancement seen throughout the intra-abdominal aorta and its branch vessels. No pathologically enlarged intra-abdominal the or pelvic lymph nodes identified. Reproductive: Uterus appears to be absent. Ovaries within normal limits. Small 19 mm cyst noted within the right ovary. Other: No free intraperitoneal air.  No free fluid. Musculoskeletal: No acute osseous abnormality. No worrisome lytic or blastic osseous lesions. IMPRESSION: 1. Hazy inflammatory stranding about the pancreatic head, suggesting acute pancreatitis. No complication identified. 2. No other  acute intra-abdominal or pelvic process identified. Electronically Signed   By: Rise Mu M.D.   On: 02/02/2017 03:45      EKG: Independently reviewed.    Assessment/Plan Principal Problem:   Acute idiopathic  pancreatitis Patient denies any history of gallstones, she is status postcholecystectomy Lipid panel okay She denies use of alcohol, has not been started on any new  medications Will order right upper quadrant ultrasound to rule out common bile duct stones, may benefit from MRCP/ERCP to rule out pancreatic stricture This is her first episode of pancreatitis Pt takes naproxen and prednisone (both class III) and sertraline (class IV)-therefore relatively have a low incidence of causing pancreatitis Patient has been started on conservative treatment with aggressive IV hydration, antiemetics,   Depression Hold off on Zoloft for now    Insomnia Continue Ambien    DVT prophylaxis: Lovenox     Code Status Orders Full code        consults called:  Family Communication: Admission, patients condition and plan of care including tests being ordered have been discussed with the patient /Daughter  who indicates understanding and agree with the plan and Code Status  Admission status: inpatient    Disposition plan: Further plan will depend as patient's clinical course evolves and further radiologic and laboratory data become available. Likely home when stable   At the time of admission, it appears that the appropriate admission status for this patient is INPATIENT .Thisis judged to be reasonable and necessary in order to provide the required intensity of service to ensure the patient's safetygiven thepresenting symptoms, physical exam findings, and initial radiographic and laboratory data in the context of their chronic comorbidities.   Richarda Overlie MD [facility] Pager [phone]  If 7PM-7AM, please contact night-coverage [email or web address] Password [email or web address]  02/02/2017, 10:23 AM

## 2017-02-02 NOTE — ED Provider Notes (Signed)
[facility] Provider Note: Virginia Dell, MD, FACEP  CSN: [id number] MRN: [id number] ARRIVAL: 02/02/17 at 0112 ROOM: MH09/MH09   CHIEF COMPLAINT  Abdominal Pain   HISTORY OF PRESENT ILLNESS  Virginia King is a 51 y.o. female with a three-day history of upper abdominal pain. The pain is most prominent in the right upper quadrant but is also felt centrally and on the left upper quadrant. She describes it as sharp and constant. The pain is moderate to severe. It is worse with movement or palpation. There is been no associated nausea, vomiting, diarrhea, fever or chills. Eating exacerbates the pain in her epigastric region giving her a feeling of being "really full". Nothing seems to make the pain better. The patient denies alcohol use.   Past Medical History:  Diagnosis Date  . Bell's palsy   . Depression     Past Surgical History:  Procedure Laterality Date  . ABDOMINAL HYSTERECTOMY    . VAGINA SURGERY      No family history on file.  Social History  Substance Use Topics  . Smoking status: Never Smoker  . Smokeless tobacco: Never Used  . Alcohol use No    Prior to Admission medications   Medication Sig Start Date End Date Taking? Authorizing Provider  paliperidone (INVEGA) 9 MG 24 hr tablet Take 18 mg by mouth daily.    Historical Provider, MD  sertraline (ZOLOFT) 100 MG tablet Take 200 mg by mouth daily.    Historical Provider, MD  zolpidem (AMBIEN) 10 MG tablet Take 10 mg by mouth at bedtime as needed for sleep.    Historical Provider, MD    Allergies Patient has no known allergies.   REVIEW OF SYSTEMS  Negative except as noted here or in the History of Present Illness.   PHYSICAL EXAMINATION  Initial Vital Signs Blood pressure 124/88, pulse 72, temperature 98.1 F (36.7 C), temperature source Oral, resp. rate 20, height 5\' 3"  (1.6 m), weight 205 lb (93 kg), SpO2 100 %.  Examination General: Well-developed, well-nourished female in no  acute distress; appearance consistent with age of record HENT: normocephalic; atraumatic Eyes: pupils equal, round and reactive to light; extraocular muscles intact Neck: supple Heart: regular rate and rhythm Lungs: clear to auscultation bilaterally Abdomen: soft; nondistended; upper abdominal tenderness most prominent in the right upper quadrant; no masses or hepatosplenomegaly; bowel sounds present; gallbladder not visualized with bedside ultrasound Extremities: No deformity; full range of motion; pulses normal Neurologic: Awake, alert and oriented; motor function intact in all extremities and symmetric; no facial droop Skin: Warm and dry Psychiatric: Normal mood and affect   RESULTS  Summary of this visit's results, reviewed by myself:   EKG Interpretation  Date/Time:    Ventricular Rate:    PR Interval:    QRS Duration:   QT Interval:    QTC Calculation:   R Axis:     Text Interpretation:        Laboratory Studies: Results for orders placed or performed during the hospital encounter of 02/02/17 (from the past 24 hour(s))  CBC with Differential/Platelet     Status: Abnormal   Collection Time: 02/02/17  1:43 AM  Result Value Ref Range   WBC 9.0 4.0 - 10.5 K/uL   RBC 3.91 3.87 - 5.11 MIL/uL   Hemoglobin 11.5 (L) 12.0 - 15.0 g/dL   HCT 78.2 (L) 95.6 - 21.3 %   MCV 89.3 78.0 - 100.0 fL   MCH 29.4 26.0 - 34.0 pg  MCHC 33.0 30.0 - 36.0 g/dL   RDW 16.113.3 09.611.5 - 04.515.5 %   Platelets 301 150 - 400 K/uL   Neutrophils Relative % 64 %   Neutro Abs 5.7 1.7 - 7.7 K/uL   Lymphocytes Relative 26 %   Lymphs Abs 2.3 0.7 - 4.0 K/uL   Monocytes Relative 9 %   Monocytes Absolute 0.8 0.1 - 1.0 K/uL   Eosinophils Relative 1 %   Eosinophils Absolute 0.1 0.0 - 0.7 K/uL   Basophils Relative 0 %   Basophils Absolute 0.0 0.0 - 0.1 K/uL  Comprehensive metabolic panel     Status: Abnormal   Collection Time: 02/02/17  1:43 AM  Result Value Ref Range   Sodium 137 135 - 145 mmol/L   Potassium  4.4 3.5 - 5.1 mmol/L   Chloride 103 101 - 111 mmol/L   CO2 28 22 - 32 mmol/L   Glucose, Bld 105 (H) 65 - 99 mg/dL   BUN 13 6 - 20 mg/dL   Creatinine, Ser 4.090.69 0.44 - 1.00 mg/dL   Calcium 9.0 8.9 - 81.110.3 mg/dL   Total Protein 6.9 6.5 - 8.1 g/dL   Albumin 3.8 3.5 - 5.0 g/dL   AST 21 15 - 41 U/L   ALT 12 (L) 14 - 54 U/L   Alkaline Phosphatase 71 38 - 126 U/L   Total Bilirubin 0.5 0.3 - 1.2 mg/dL   GFR calc non Af Amer >60 >60 mL/min   GFR calc Af Amer >60 >60 mL/min   Anion gap 6 5 - 15  Lipase, blood     Status: Abnormal   Collection Time: 02/02/17  1:43 AM  Result Value Ref Range   Lipase 71 (H) 11 - 51 U/L   Imaging Studies: Ct Abdomen Pelvis W Contrast  Result Date: 02/02/2017 CLINICAL DATA:  Initial evaluation for acute right upper quadrant abdominal pain. EXAM: CT ABDOMEN AND PELVIS WITH CONTRAST TECHNIQUE: Multidetector CT imaging of the abdomen and pelvis was performed using the standard protocol following bolus administration of intravenous contrast. CONTRAST:  100mL ISOVUE-300 IOPAMIDOL (ISOVUE-300) INJECTION 61% COMPARISON:  None available. FINDINGS: Lower chest: Mild subsegmental atelectasis seen dependently within the visualized lung bases. Visualized lungs are otherwise clear. Hepatobiliary: Liver demonstrates a normal contrast enhanced appearance. Gallbladder appears to be absent. No biliary dilatation. Pancreas: Mild inflammatory changes present about the pancreatic head, suggesting acute pancreatitis (series 2, image 42). No loculated collection or evidence for pancreatic necrosis. Relative sparing of the pancreatic body and tail. Spleen: Spleen within normal limits. Adrenals/Urinary Tract: Adrenal glands are normal. Kidneys equal in size with symmetric enhancement. No nephrolithiasis, hydronephrosis, or focal enhancing renal mass. No hydroureter. Bladder within normal limits. Stomach/Bowel: Stomach within normal limits. Mild hazy stranding about the duodenum related to the  inflammatory changes within the adjacent pancreas. No evidence for bowel obstruction. Appendix is normal. No acute inflammatory changes seen about the bowels. Vascular/Lymphatic: Normal intravascular enhancement seen throughout the intra-abdominal aorta and its branch vessels. No pathologically enlarged intra-abdominal the or pelvic lymph nodes identified. Reproductive: Uterus appears to be absent. Ovaries within normal limits. Small 19 mm cyst noted within the right ovary. Other: No free intraperitoneal air.  No free fluid. Musculoskeletal: No acute osseous abnormality. No worrisome lytic or blastic osseous lesions. IMPRESSION: 1. Hazy inflammatory stranding about the pancreatic head, suggesting acute pancreatitis. No complication identified. 2. No other acute intra-abdominal or pelvic process identified. Electronically Signed   By: Rise MuBenjamin  McClintock M.D.   On: 02/02/2017 03:45  ED COURSE  Nursing notes and initial vitals signs, including pulse oximetry, reviewed.  Vitals:   02/02/17 0119 02/02/17 0124  BP: 124/88   Pulse: 72   Resp: 20   Temp: 98.1 F (36.7 C)   TempSrc: Oral   SpO2: 100%   Weight:  205 lb (93 kg)  Height:  5\' 3"  (1.6 m)   4:28 AM Dr. Clyde Lundborg except for transfer to [facility] inpatient care.  PROCEDURES    ED DIAGNOSES     ICD-9-CM ICD-10-CM   1. Idiopathic acute pancreatitis without infection or necrosis 577.0 K85.00        Paula Libra, MD 02/02/17 [id number]

## 2017-02-02 NOTE — ED Notes (Signed)
ED Provider at bedside. 

## 2017-02-02 NOTE — ED Triage Notes (Signed)
Sharp RUQ pain x3 days. Tender to touch. Sts she feels like she is "really full".  Pain is constant.  Denies n/v/d. Worse when she eats.

## 2017-02-02 NOTE — ED Notes (Signed)
[facility] here to transport [facility] to [facility]

## 2017-02-03 DIAGNOSIS — K8500 Idiopathic acute pancreatitis without necrosis or infection: Secondary | ICD-10-CM | POA: Diagnosis not present

## 2017-02-03 DIAGNOSIS — E6609 Other obesity due to excess calories: Secondary | ICD-10-CM | POA: Diagnosis not present

## 2017-02-03 DIAGNOSIS — F329 Major depressive disorder, single episode, unspecified: Secondary | ICD-10-CM

## 2017-02-03 DIAGNOSIS — Z6836 Body mass index (BMI) 36.0-36.9, adult: Secondary | ICD-10-CM

## 2017-02-03 DIAGNOSIS — E66812 Obesity, class 2: Secondary | ICD-10-CM

## 2017-02-03 LAB — HIV ANTIBODY (ROUTINE TESTING W REFLEX): HIV Screen 4th Generation wRfx: NONREACTIVE

## 2017-02-03 LAB — HEMOGLOBIN A1C
Hgb A1c MFr Bld: 5 % (ref 4.8–5.6)
MEAN PLASMA GLUCOSE: 97 mg/dL

## 2017-02-03 MED ORDER — HYDROCODONE-ACETAMINOPHEN 5-325 MG PO TABS: 1.0000 | ORAL_TABLET | Freq: Four times a day (QID) | ORAL | 0 refills | Status: AC | PRN

## 2017-02-03 MED ORDER — IBUPROFEN 200 MG PO TABS: 400.0000 mg | ORAL_TABLET | Freq: Three times a day (TID) | ORAL | 0 refills | Status: DC | PRN

## 2017-02-03 MED ORDER — ACETAMINOPHEN 325 MG PO TABS
650.0000 mg | ORAL_TABLET | Freq: Four times a day (QID) | ORAL | Status: DC | PRN
  Administered 2017-02-03: 650 mg via ORAL
  Filled 2017-02-03: qty 2

## 2017-02-03 MED ORDER — ONDANSETRON 8 MG PO TBDP: 8.0000 mg | ORAL_TABLET | Freq: Three times a day (TID) | ORAL | 0 refills | Status: DC | PRN

## 2017-02-03 MED ORDER — ZOLPIDEM TARTRATE 5 MG PO TABS: 5.0000 mg | ORAL_TABLET | Freq: Every evening | ORAL | Status: DC | PRN

## 2017-02-03 NOTE — Care Management Obs Status (Signed)
MEDICARE OBSERVATION STATUS NOTIFICATION   Patient Details  Name: Virginia King MRN: [id number] Date of Birth: 08/06/1966   Medicare Observation Status Notification Given:  Yes    Golda AcreDavis, Rhonda Lynn, RN 02/03/2017, 9:36 AM

## 2017-02-03 NOTE — Progress Notes (Signed)
Date: February 03, 2017 Discharge orders checked for needs. No case management needs present at time of discharge. Rhonda Davis, RN, BSN, CCM   [phone] 

## 2017-02-03 NOTE — Progress Notes (Signed)
Pt discharged to home with daughter.  Vitals stable, denies pain. Pt has all belongings.  Discussed discharge instructions with patient and daughter, verbalized understanding.  Pt has prescriptions.  No concerns at this time.  Barrie LymeVance, Sundi Slevin E RN 3:24 PM 02/03/2017

## 2017-02-03 NOTE — Discharge Instructions (Signed)
Pancreatitis aguda °(Acute Pancreatitis) °La pancreatitis aguda es una afección en la cual el páncreas de repente se irrita e hincha (tiene inflamación). El páncreas es una glándula que se encuentra detrás del estómago. Produce enzimas que ayudan a digerir la comida. El páncreas también libera las hormonas glucagón e insulina que regulan el nivel de azúcar en la sangre. El daño al páncreas se produce cuando las enzimas digestivas del páncreas se activan antes de liberarse en el intestino. °La mayor parte de los ataques duran un par de días y pueden ocasionar problemas graves. Algunas personas se deshidratan y les baja la presión arterial. En los casos graves, una hemorragia en el páncreas puede causar shock y ser potencialmente mortal. Los pulmones, el corazón y los riñones pueden fallar. °CAUSAS °Las causas más frecuentes para esta afección son las siguientes: °· El consumo excesivo de alcohol. °· Cálculos biliares. °Algunas otras causas son las siguientes: °· Ciertos medicamentos. °· La exposición a ciertas sustancias químicas. °· Infección. °· Daño producido por un accidente (traumatismo). °· Una cirugía abdominal. °En algunos casos, es posible que la causa no se conozca. °SÍNTOMAS °Los síntomas de esta afección incluyen lo siguiente: °· Dolor en la zona superior del abdomen que puede irradiarse a la espalda. °· Dolor a la palpación e hinchazón en el abdomen. °· Náuseas y vómitos. °DIAGNÓSTICO °Esta afección se puede diagnosticar en función de lo siguiente: °· Un examen físico. °· Análisis de sangre. °· Estudio de diagnóstico por imágenes, como radiografías, tomografías computarizadas, o una ecografía de abdomen. °TRATAMIENTO °El tratamiento generalmente requiere que permanezca en el hospital. El tratamiento puede incluir lo siguiente: °· Analgésicos. °· Reemplazo de líquidos a través de una vía intravenosa (IV). °· Colocar una vía en el estómago para retirar el contenido del estómago y controlar los vómitos  (sonda NG, o sonda nasogástrica). °· No comer durante 3 o 4 días. Esto permite que el páncreas descanse y no produzca enzimas que podrían causar más daño. °· Antibióticos, si la causa de la enfermedad es una infección. °· Cirugía del páncreas o de la vesícula biliar. °INSTRUCCIONES PARA EL CUIDADO EN EL HOGAR °Comida y bebida °· Siga las indicaciones del médico acerca de la dieta. Puede consistir en evitar el alcohol y disminuir la cantidad de grasas que consume en la dieta. °· Consuma porciones pequeñas y con frecuencia. Esto reduce la cantidad de líquidos digestivos que produce el páncreas. °· Beba suficiente líquido para mantener la orina clara o de color amarillo pálido. °· No consuma alcohol si esta fue la causa de su enfermedad. °Instrucciones generales °· Tome los medicamentos de venta libre y los recetados solamente como se lo haya indicado el médico. °· No consuma ningún producto que contenga tabaco, lo que incluye cigarrillos, tabaco de mascar y cigarrillos electrónicos. Si necesita ayuda para dejar de fumar, consulte al médico. °· Descanse lo suficiente. °· Si se lo indican, controle su nivel de azúcar en la sangre en su hogar como se lo haya indicado el médico. °· Concurra a todas las visitas de control como se lo haya indicado el médico. Esto es importante. °SOLICITE ATENCIÓN MÉDICA SI: °· No mejora en el tiempo en que se esperaba. °· Tiene síntomas nuevos o graves. °· Tiene dolor persistente, náuseas o debilidad. °· Se recupera y luego vuelve a sentir dolor. °· Tiene fiebre. °SOLICITE ATENCIÓN MÉDICA DE INMEDIATO SI: °· No puede comer ni retener líquidos. °· Tiene dolor intenso. °· Nota que la piel o la zona blanca del ojo están amarillas (  ictericia).  Vomita.  Se siente mareado o se desmaya.  Su nivel de azcar en la sangre es alto (ms de 300 mg/dl). Esta informacin no tiene Theme park managercomo fin reemplazar el consejo del mdico. Asegrese de hacerle al mdico cualquier pregunta que tenga. Document  Released: 08/26/2005 Document Revised: 03/09/2016 Document Reviewed: 08/20/2015 Elsevier Interactive Patient Education  2017 Elsevier Inc.  Dieta con bajo contenido de grasas para la pancreatitis o los trastornos de la vescula (Low-Fat Diet for Pancreatitis or Gallbladder Conditions) Una dieta con bajo contenido de grasas puede ser til si usted tiene pancreatitis o trastornos de la vescula. En estos trastornos, el pncreas y la vescula tienen dificultades para digerir las grasas. Un plan de alimentacin saludable con menos grasas ayudar a que el pncreas y la vescula descansen, y reducir los sntomas. QU DEBO SABER ACERCA DE ESTA DIETA?  Consuma una dieta con bajo contenido de [place]grasas.  Reduzca el consumo de grasas a menos del 20% al 30% del total de caloras diarias. Esto representa menos de 50a 60gramos de grasas por da.  Recuerde que la dieta debe incluir algo de [place]asa. Consulte al nutricionista cul debe ser su meta diaria.  Elija alimentos saludables sin grasas o con bajo contenido de grasas. Busque las palabras sin grasa, bajo en grasas o bajo contenido de [place]grasas.  Energy East CorporationComo gua, mire la etiqueta y elija alimentos con menos de 3gramos de grasa por porcin. Coma solo una porcin.  Evite el alcohol.  No fumar. Si necesita ayuda para dejar de fumar, hable con el mdico.  Haga comidas pequeas y frecuentes en lugar de 3 comidas abundantes y pesadas. QU ALIMENTOS PUEDO COMER? Cereales Incluya granos y almidones saludables, como papas, pan integral, cereales ricos en fibras y Harlon Dittyarroz integral. Elija opciones de cereales integrales siempre que sea posible. En los adultos, los cereales integrales deben representar del 45% al 65% de las caloras diarias. Nils PyleFrutas y verduras Coma muchas frutas y verduras. Las frutas y verduras frescas aportan fibra a la dieta. Carnes y otras fuentes de protenas Coma carnes Bereamagras, como pollo y cerdo. Quite la grasa de la carne antes de  cocinarla. Los [place] Valleyhuevos, el pescado y los frijoles son otras fuentes de protenas. En los adultos, estos alimentos deben representar entre el 10% y el 35% de las caloras diarias. Lcteos Northeast UtilitiesElija leche y otros productos lcteos con bajo contenido de [place]grasas. Los lcteos aportan grasas y protenas, adems de calcio. Grasas y Allstateaceites Limite los alimentos con alto contenido de grasas, como las comidas fritas, los [place]odulces, los productos horneados y las bebidas azucaradas. Otros Los condimentos y las salsas cremosas, como la [place]onesa, [place]apueden aportar grasa adicional. Piense si necesita usarlos o no, o use pequeas cantidades u opciones con bajo contenido de grasas. QU ALIMENTOS NO ESTN RECOMENDADOS?  Los alimentos con alto contenido de In[place]grasas, por ejemplo:  Alimentos horneados.  Helados.  Tostadas francesas.  Panecillos dulces.  Pizza.  Pan de queso.  Alimentos rebozados o cubiertos con [place]omantequilla, salsas cremosas o queso.  Comidas fritas.  Postres y bebidas azucaradas.  Alimentos que causan gases o meteorismo Esta informacin no tiene Theme park managercomo fin reemplazar el consejo del mdico. Asegrese de hacerle al mdico cualquier pregunta que tenga. Document Released: 11/21/2013 Document Revised: 11/21/2013 Document Reviewed: 10/30/2013 Elsevier Interactive Patient Education  2017 [employer]vier Inc.

## 2017-02-03 NOTE — Discharge Summary (Signed)
Physician Discharge Summary  Virginia King ZOX:[id number] DOB: 11-Sep-1966 DOA: 02/02/2017  PCP: Pcp Not In System  Admit date: 02/02/2017 Discharge date: 02/03/2017  Time spent: 35 minutes  Recommendations for Outpatient Follow-up:  1. Repeat CMET to follow electrolytes, LFT's and renal function 2. Arrange follow up with GI; for re-evaluation and further work up if needed on her pancreatitis. Also for colonoscopy screening.   Discharge Diagnoses:  Principal Problem:   Idiopathic acute pancreatitis without infection or necrosis Active Problems:   Depression   Class 2 obesity due to excess calories with body mass index (BMI) of 36.0 to 36.9 in adult Fatty liver  Discharge Condition: stable and improved. Discharge home with instructions to follow ith PCP in 10 days  Diet recommendation: low calorie and fat diet  Filed Weights   02/02/17 0124  Weight: 93 kg (205 lb)    History of present illness:  51 y.o.femalewith history of Bell's palsy, depression, rosacea,, presents with a   three-day history of epigastric abdominal pain. Pain increases after eating. Radiates into the right upper quadrant. Also felt centrally and on the left upper quadrant. Patient prior hx of cholecystectomy and pancreatitis multiple years ago. No alcohol, no tobacco and no recreational drugs. Patient found to have elevated lipase and CT scan demonstrating pancreatitis.  Hospital Course:  1-acute pancreatitis: -idiopathic until now -abd US demonstrating fatty liver; but otherwise unrevealing  -patient improved with bowel rest, IVF's and PRN analgesics -at discharge tolerating full/soft diet and minimal use of PO analgesics -discharge with prescription for vicodin (for severe pain); PRN ibuprofen for moderate discomfort and PRN antiemetics. -will benefit of outpatient follow up with GI (for further evaluation of her pancreatitis process and also screening, has never had a  colonoscopy)  2-depression -no SI or hallucinations -resume home antidepressant regimen   3-class 2 obesity -Body mass index is 36.31 kg/m. -low calorie diet and increase exercise discussed with patient   Procedures:  See below for x-ray reports   Consultations:  None   Discharge Exam: Vitals:   02/02/17 2147 02/03/17 0600  BP: 119/74 126/70  Pulse: 71 72  Resp:  15  Temp: 98 F (36.7 C) 97.5 F (36.4 C)    General: afebrile, denies CP, nausea, vomiting and reports just mild abd discomfort. Tolerating diet and PO analgesics. Cardiovascular: S1 and S2, no rubs, no gallops Respiratory: good air movement, no wheezing, no crackles Abd: soft, mild mid abd Tenderness on deep palpation, ND, positive BS Extremities: no edema, no cyanosis    Discharge Instructions   Discharge Instructions    Discharge instructions    Complete by:  As directed    Take medications as prescribed  Arrange follow up with PCP in 10 days Please follow low fat diet and increase physical activity  Keep yourself well hydrated Ok to use over the counter laxatives as needed for constipation (as pain medications can cause constipation)     Current Discharge Medication List    START taking these medications   Details  ondansetron (ZOFRAN ODT) 8 MG disintegrating tablet Take 1 tablet (8 mg total) by mouth every 8 (eight) hours as needed for nausea or vomiting. Qty: 20 tablet, Refills: 0      CONTINUE these medications which have CHANGED   Details  HYDROcodone-acetaminophen (NORCO/VICODIN) 5-325 MG tablet Take 1-2 tablets by mouth every 6 (six) hours as needed for severe pain. Qty: 30 tablet, Refills: 0    ibuprofen (ADVIL,MOTRIN) 200 MG tablet Take 2 tablets (400  mg total) by mouth every 8 (eight) hours as needed for fever, headache or moderate pain. Qty: 30 tablet, Refills: 0      STOP taking these medications     cyclobenzaprine (FLEXERIL) 10 MG tablet      naproxen (NAPROSYN) 500 MG  tablet      naproxen (NAPROSYN) 500 MG tablet      penicillin v potassium (VEETID) 500 MG tablet      predniSONE (DELTASONE) 10 MG tablet      predniSONE (DELTASONE) 20 MG tablet      QUEtiapine (SEROQUEL XR) 200 MG 24 hr tablet      traMADol (ULTRAM) 50 MG tablet      traZODone (DESYREL) 100 MG tablet      valACYclovir (VALTREX) 1000 MG tablet      zolpidem (AMBIEN) 10 MG tablet        No Known Allergies    The results of significant diagnostics from this hospitalization (including imaging, microbiology, ancillary and laboratory) are listed below for reference.    Significant Diagnostic Studies: US Abdomen Complete  Result Date: 02/02/2017 CLINICAL DATA:  Acute pancreatitis EXAM: ABDOMEN ULTRASOUND COMPLETE COMPARISON:  CT from earlier in the same day FINDINGS: Gallbladder: Surgically removed Common bile duct: Diameter: 8 mm Liver: Increased in echogenicity consistent with fatty infiltration. No focal mass lesion is noted. IVC: No abnormality visualized. Pancreas: Visualized portion is within normal limits. The head where the area of acute pancreatitis is not well visualized due to overlying bowel gas Spleen: Size and appearance within normal limits. Right Kidney: Length: 11.5 cm. Echogenicity within normal limits. No mass or hydronephrosis visualized. Left Kidney: Length: 12.1 cm. Echogenicity within normal limits. No mass or hydronephrosis visualized. Abdominal aorta: No aneurysm visualized. Other findings: None. IMPRESSION: Status post cholecystectomy. Fatty liver. The area of acute pancreatitis is not well appreciated on this exam. Electronically Signed   By: Alcide Clever M.D.   On: 02/02/2017 13:17   Ct Abdomen Pelvis W Contrast  Result Date: 02/02/2017 CLINICAL DATA:  Initial evaluation for acute right upper quadrant abdominal pain. EXAM: CT ABDOMEN AND PELVIS WITH CONTRAST TECHNIQUE: Multidetector CT imaging of the abdomen and pelvis was performed using the standard protocol  following bolus administration of intravenous contrast. CONTRAST:  ISOVUE-300 IOPAMIDOL (ISOVUE-300) INJECTION 61% COMPARISON:  None available. FINDINGS: Lower chest: Mild subsegmental atelectasis seen dependently within the visualized lung bases. Visualized lungs are otherwise clear. Hepatobiliary: Liver demonstrates a normal contrast enhanced appearance. Gallbladder appears to be absent. No biliary dilatation. Pancreas: Mild inflammatory changes present about the pancreatic head, suggesting acute pancreatitis (series 2, image 42). No loculated collection or evidence for pancreatic necrosis. Relative sparing of the pancreatic body and tail. Spleen: Spleen within normal limits. Adrenals/Urinary Tract: Adrenal glands are normal. Kidneys equal in size with symmetric enhancement. No nephrolithiasis, hydronephrosis, or focal enhancing renal mass. No hydroureter. Bladder within normal limits. Stomach/Bowel: Stomach within normal limits. Mild hazy stranding about the duodenum related to the inflammatory changes within the adjacent pancreas. No evidence for bowel obstruction. Appendix is normal. No acute inflammatory changes seen about the bowels. Vascular/Lymphatic: Normal intravascular enhancement seen throughout the intra-abdominal aorta and its branch vessels. No pathologically enlarged intra-abdominal the or pelvic lymph nodes identified. Reproductive: Uterus appears to be absent. Ovaries within normal limits. Small 19 mm cyst noted within the right ovary. Other: No free intraperitoneal air.  No free fluid. Musculoskeletal: No acute osseous abnormality. No worrisome lytic or blastic osseous lesions. IMPRESSION: 1. Hazy inflammatory  stranding about the pancreatic head, suggesting acute pancreatitis. No complication identified. 2. No other acute intra-abdominal or pelvic process identified. Electronically Signed   By: Rise MuBenjamin  McClintock M.D.   On: 02/02/2017 03:45   Labs: Basic Metabolic Panel:  Recent  Labs Lab 02/02/17 0143 02/02/17 1146  NA 137  --   K 4.4  --   CL 103  --   CO2 28  --   GLUCOSE 105*  --   BUN 13  --   CREATININE 0.69 0.64  CALCIUM 9.0  --   MG  --  1.8   Liver Function Tests:  Recent Labs Lab 02/02/17 0143  AST 21  ALT 12*  ALKPHOS 71  BILITOT 0.5  PROT 6.9  ALBUMIN 3.8    Recent Labs Lab 02/02/17 0143  LIPASE 71*   CBC:  Recent Labs Lab 02/02/17 0143 02/02/17 1146  WBC 9.0 6.1  NEUTROABS 5.7  --   HGB 11.5* 11.5*  HCT 34.9* 34.2*  MCV 89.3 88.1  PLT 301 253    Signed:  Vassie LollMadera, Rohn Fritsch MD.  [facility] 02/03/2017, 1:47 PM

## 2018-02-10 IMAGING — CT CT ABD-PELV W/ CM
2 of 5 series · 15 of 46 positions shown, 17 images · IV contrast (APPLIED)
Comparison: None available.

CLINICAL DATA: Initial evaluation for acute right upper quadrant
abdominal pain.

EXAM:
CT ABDOMEN AND PELVIS WITH CONTRAST
TECHNIQUE: Multidetector CT imaging of the abdomen and pelvis was performed
using the standard protocol following bolus administration of
intravenous contrast.
CONTRAST:  100mL [id number]-888 IOPAMIDOL ([id number]) INJECTION 61%

[Series 2: axial st · axial · 0.92mm/px · z∈[-513,-33]mm · 12 of 108 slices shown, 14 images]
[im 6/108  soft-tissue]
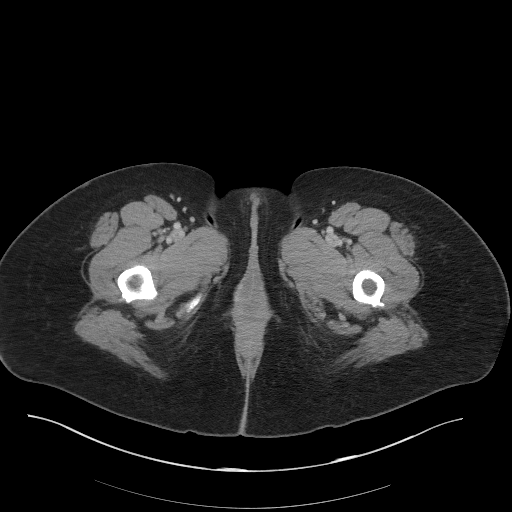
[im 6/108  bone]
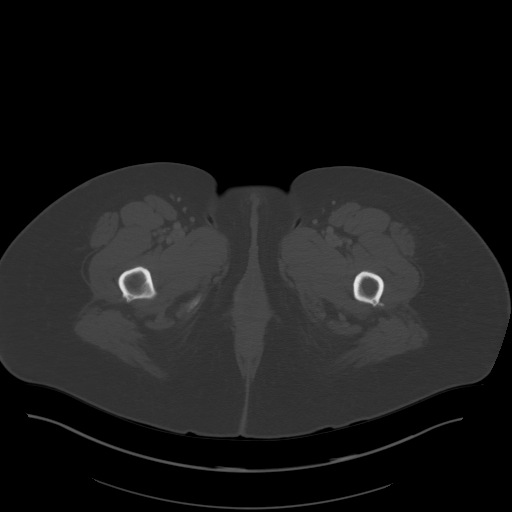
[im 18/108  soft-tissue]
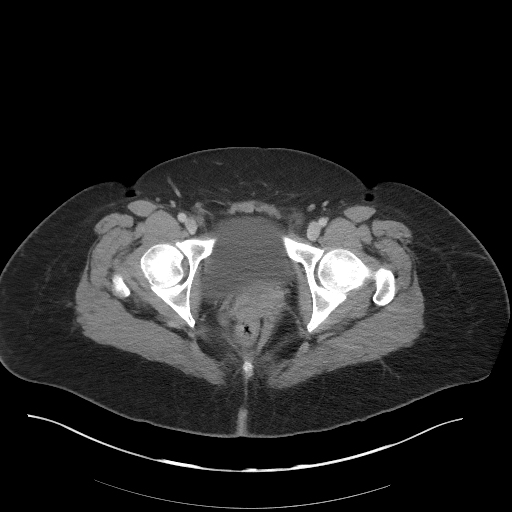
[im 24/108  soft-tissue]
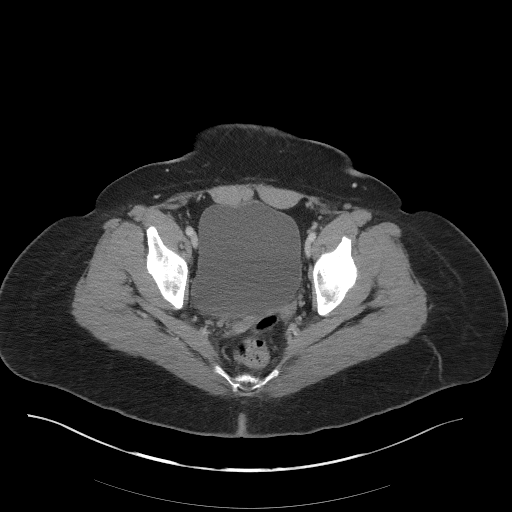
[im 30/108  soft-tissue]
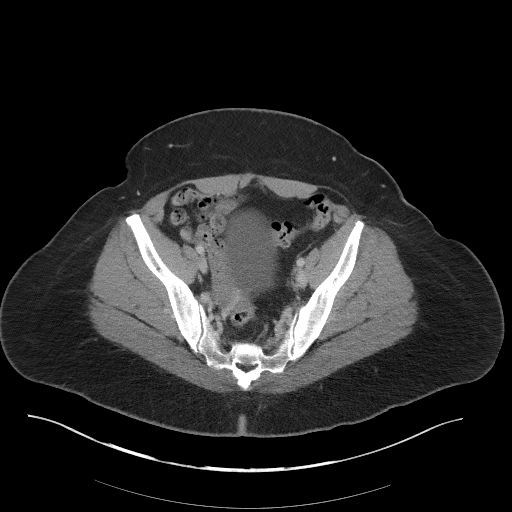
[im 42/108  soft-tissue]
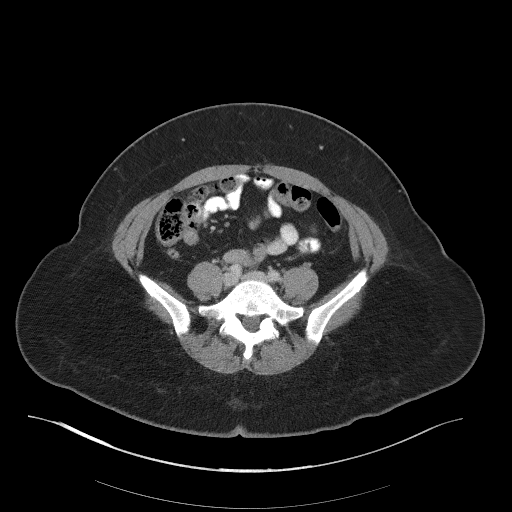
[im 48/108  soft-tissue]
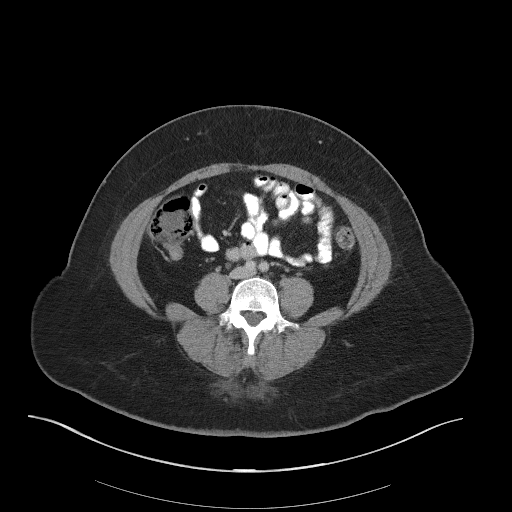
[im 60/108  soft-tissue]
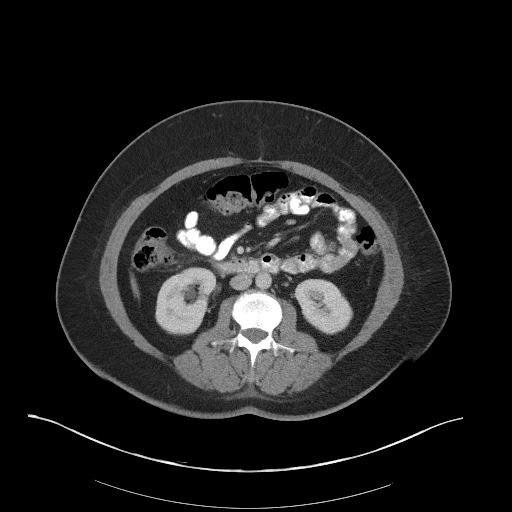
[im 66/108  soft-tissue]
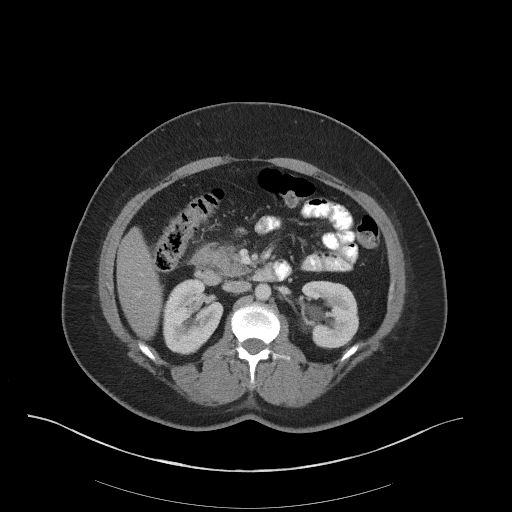
[im 78/108  soft-tissue]
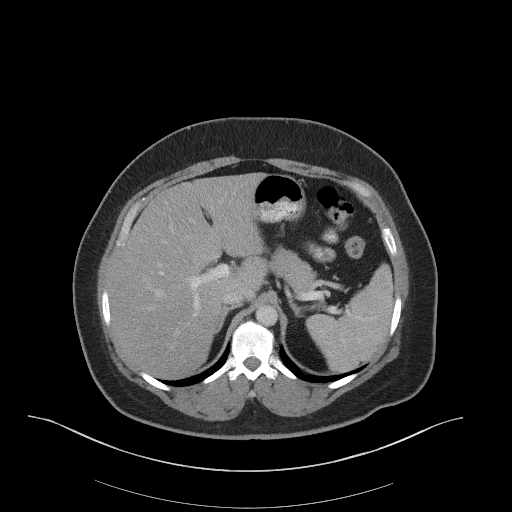
[im 78/108  bone]
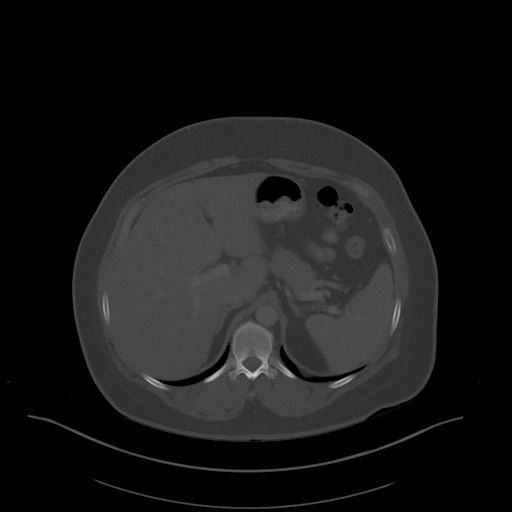
[im 84/108  soft-tissue]
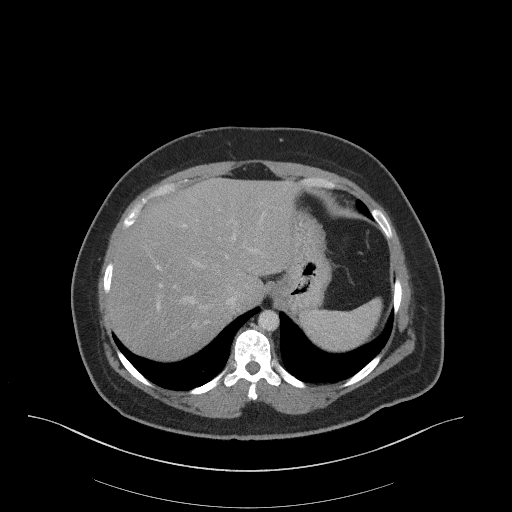
[im 90/108  soft-tissue]
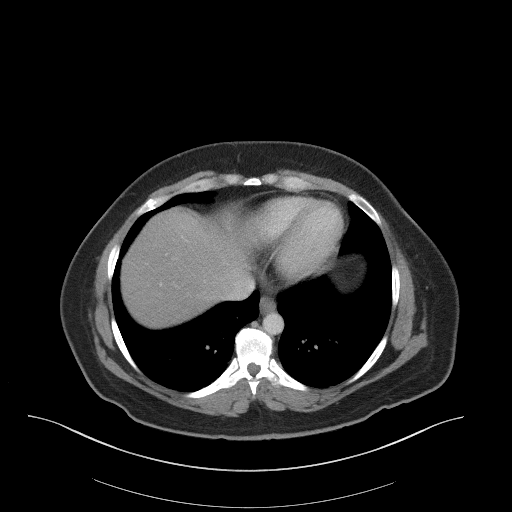
[im 102/108  soft-tissue]
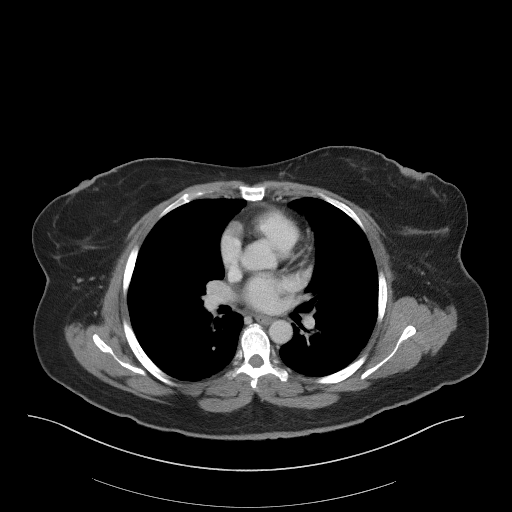

[Series 5: coronal st · coronal · 0.79mm/px · 3 of 87 slices shown]
[im 29/87  soft-tissue]
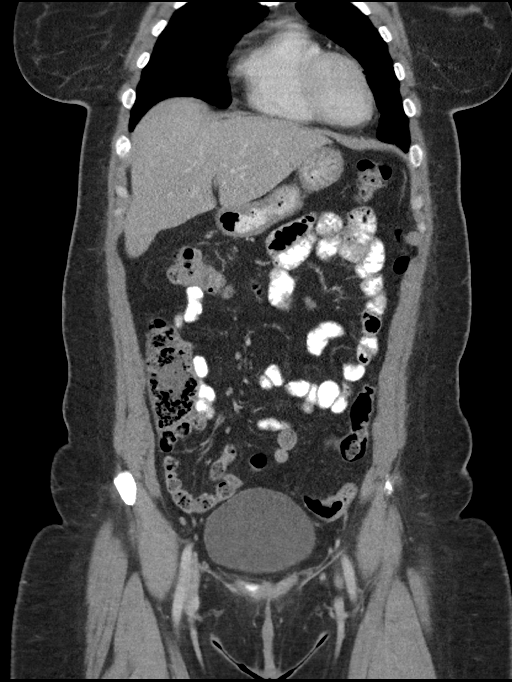
[im 39/87  soft-tissue]
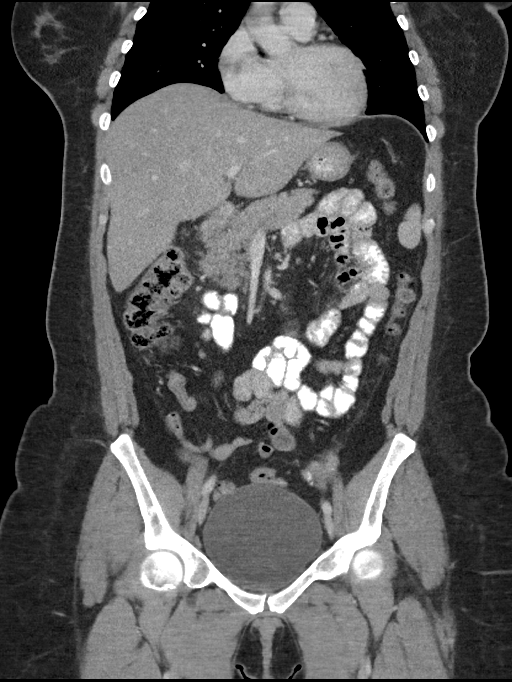
[im 48/87  soft-tissue]
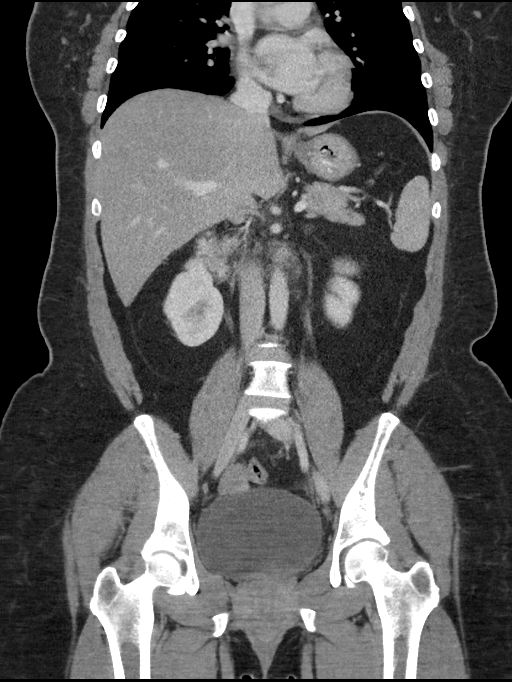

[15 of 46 positions shown; findings below may reference images not displayed]

FINDINGS: Lower chest: Mild subsegmental atelectasis seen dependently within
the visualized lung bases. Visualized lungs are otherwise clear.

Hepatobiliary: Liver demonstrates a normal contrast enhanced
appearance. Gallbladder appears to be absent. No biliary dilatation.

Pancreas: Mild inflammatory changes present about the pancreatic
head, suggesting acute pancreatitis (series 2, image 42). No
loculated collection or evidence for pancreatic necrosis. Relative
sparing of the pancreatic body and tail.

Spleen: Spleen within normal limits.

Adrenals/Urinary Tract: Adrenal glands are normal. Kidneys equal in
size with symmetric enhancement. No nephrolithiasis, hydronephrosis,
or focal enhancing renal mass. No hydroureter. Bladder within normal
limits.

Stomach/Bowel: Stomach within normal limits. Mild hazy stranding
about the duodenum related to the inflammatory changes within the
adjacent pancreas. No evidence for bowel obstruction. Appendix is
normal. No acute inflammatory changes seen about the bowels.

Vascular/Lymphatic: Normal intravascular enhancement seen throughout
the intra-abdominal aorta and its branch vessels. No pathologically
enlarged intra-abdominal the or pelvic lymph nodes identified.

Reproductive: Uterus appears to be absent. Ovaries within normal
limits. Small 19 mm cyst noted within the right ovary.

Other: No free intraperitoneal air.  No free fluid.

Musculoskeletal: No acute osseous abnormality. No worrisome lytic or
blastic osseous lesions.
IMPRESSION: 1. Hazy inflammatory stranding about the pancreatic head, suggesting
acute pancreatitis. No complication identified.
2. No other acute intra-abdominal or pelvic process identified.

## 2018-05-31 ENCOUNTER — Other Ambulatory Visit: Payer: Self-pay

## 2018-05-31 ENCOUNTER — Emergency Department (HOSPITAL_BASED_OUTPATIENT_CLINIC_OR_DEPARTMENT_OTHER)
Admission: EM | Admit: 2018-05-31 | Discharge: 2018-05-31 | Disposition: A | Discharge status: Home / Self Care | Payer: Medicare Other | Attending: Emergency Medicine | Admitting: Emergency Medicine

## 2018-05-31 ENCOUNTER — Encounter (HOSPITAL_BASED_OUTPATIENT_CLINIC_OR_DEPARTMENT_OTHER): Payer: Self-pay

## 2018-05-31 DIAGNOSIS — Z79899 Other long term (current) drug therapy: Secondary | ICD-10-CM | POA: Diagnosis not present

## 2018-05-31 DIAGNOSIS — R1013 Epigastric pain: Secondary | ICD-10-CM | POA: Diagnosis not present

## 2018-05-31 LAB — CBC WITH DIFFERENTIAL/PLATELET
BASOS ABS: 0 10*3/uL (ref 0.0–0.1)
BASOS PCT: 0 %
EOS ABS: 0.1 10*3/uL (ref 0.0–0.7)
Eosinophils Relative: 2 %
HEMATOCRIT: 40.1 % (ref 36.0–46.0)
Hemoglobin: 14 g/dL (ref 12.0–15.0)
Lymphocytes Relative: 20 %
Lymphs Abs: 1.5 10*3/uL (ref 0.7–4.0)
MCH: 30.5 pg (ref 26.0–34.0)
MCHC: 34.9 g/dL (ref 30.0–36.0)
MCV: 87.4 fL (ref 78.0–100.0)
MONO ABS: 0.6 10*3/uL (ref 0.1–1.0)
Monocytes Relative: 8 %
NEUTROS PCT: 70 %
Neutro Abs: 5.6 10*3/uL (ref 1.7–7.7)
Platelets: 261 10*3/uL (ref 150–400)
RBC: 4.59 MIL/uL (ref 3.87–5.11)
RDW: 12.9 % (ref 11.5–15.5)
WBC: 7.8 10*3/uL (ref 4.0–10.5)

## 2018-05-31 LAB — COMPREHENSIVE METABOLIC PANEL
ALBUMIN: 4.3 g/dL (ref 3.5–5.0)
ALT: 11 U/L (ref 0–44)
AST: 21 U/L (ref 15–41)
Alkaline Phosphatase: 86 U/L (ref 38–126)
Anion gap: 8 (ref 5–15)
BILIRUBIN TOTAL: 0.3 mg/dL (ref 0.3–1.2)
BUN: 12 mg/dL (ref 6–20)
CO2: 27 mmol/L (ref 22–32)
CREATININE: 0.81 mg/dL (ref 0.44–1.00)
Calcium: 9.5 mg/dL (ref 8.9–10.3)
Chloride: 105 mmol/L (ref 98–111)
GFR calc Af Amer: >60 mL/min (ref >60)
GLUCOSE: 101 mg/dL — AB (ref 70–99)
POTASSIUM: 3.7 mmol/L (ref 3.5–5.1)
Sodium: 140 mmol/L (ref 135–145)
TOTAL PROTEIN: 7.4 g/dL (ref 6.5–8.1)

## 2018-05-31 LAB — LIPASE, BLOOD: Lipase: 39 U/L (ref 11–51)

## 2018-05-31 MED ORDER — GI COCKTAIL ~~LOC~~
30.0000 mL | Freq: Once | ORAL | Status: AC
  Administered 2018-05-31: 30 mL via ORAL
  Filled 2018-05-31: qty 30

## 2018-05-31 MED ORDER — ONDANSETRON HCL 4 MG/2ML IJ SOLN
4.0000 mg | Freq: Once | INTRAMUSCULAR | Status: AC
  Administered 2018-05-31: 4 mg via INTRAVENOUS
  Filled 2018-05-31: qty 2

## 2018-05-31 MED ORDER — SODIUM CHLORIDE 0.9 % IV BOLUS
1000.0000 mL | Freq: Once | INTRAVENOUS | Status: AC
  Administered 2018-05-31: 1000 mL via INTRAVENOUS

## 2018-05-31 MED ORDER — MORPHINE SULFATE (PF) 4 MG/ML IV SOLN
4.0000 mg | Freq: Once | INTRAVENOUS | Status: AC
  Administered 2018-05-31: 4 mg via INTRAVENOUS
  Filled 2018-05-31: qty 1

## 2018-05-31 NOTE — ED Provider Notes (Signed)
[facility] EMERGENCY DEPARTMENT Provider Note   CSN: [id number] Arrival date & time: 05/31/18  1628     History   Chief Complaint Chief Complaint  Patient presents with  . Abdominal Pain    HPI Virginia King is a 52 y.o. female.  52 yo F with a chief complaint of epigastric abdominal pain.  This been going on since yesterday.  Does that it radiates down to her left side.  Worse with eating.  Denies fevers or chills denies nausea or vomiting.  Patient has had pancreatitis before and thinks this feels the same.  The history is provided by the patient and a relative. The history is limited by a language barrier. A language interpreter was used.  Abdominal Pain   This is a recurrent problem. The current episode started yesterday. The problem occurs constantly. The problem has been gradually worsening. The pain is associated with eating. The pain is located in the epigastric region. The quality of the pain is sharp and shooting. The pain is at a severity of 10/10. The pain is severe. Pertinent negatives include fever, nausea, vomiting, dysuria, headaches, arthralgias and myalgias. Nothing aggravates the symptoms. Nothing relieves the symptoms.    Past Medical History:  Diagnosis Date  . Bell's palsy   . Depression     Patient Active Problem List   Diagnosis Date Noted  . Class 2 obesity due to excess calories with body mass index (BMI) of 36.0 to 36.9 in adult   . Idiopathic acute pancreatitis without infection or necrosis 02/02/2017  . Depression     Past Surgical History:  Procedure Laterality Date  . ABDOMINAL HYSTERECTOMY    . VAGINA SURGERY       OB History    Gravida  0   Para  0   Term  0   Preterm  0   AB  0   Living        SAB  0   TAB  0   Ectopic  0   Multiple      Live Births               Home Medications    Prior to Admission medications   Medication Sig Start Date End Date Taking? Authorizing Provider    triamcinolone acetonide (TRIESENCE) 40 MG/ML SUSP Inject into the articular space. 09/10/17  Yes [provider]  HYDROcodone-acetaminophen (NORCO/VICODIN) 5-325 MG tablet Take 1-2 tablets by mouth every 6 (six) hours as needed for severe pain. 02/03/17   Vassie LollMadera, Carlos, MD  ibuprofen (ADVIL,MOTRIN) 200 MG tablet Take 2 tablets (400 mg total) by mouth every 8 (eight) hours as needed for fever, headache or moderate pain. 02/03/17   Vassie LollMadera, Carlos, MD  ondansetron (ZOFRAN ODT) 8 MG disintegrating tablet Take 1 tablet (8 mg total) by mouth every 8 (eight) hours as needed for nausea or vomiting. 02/03/17   Vassie LollMadera, Carlos, MD  paliperidone (INVEGA) 9 MG 24 hr tablet Take by mouth.    [provider]  sertraline (ZOLOFT) 100 MG tablet Take by mouth.    [provider]  zolpidem (AMBIEN) 10 MG tablet Take by mouth.    [provider]    Family History No family history on file.  Social History Social History   Tobacco Use  . Smoking status: Never Smoker  . Smokeless tobacco: Never Used  Substance Use Topics  . Alcohol use: Yes    Comment: occ  . Drug use: No  Allergies   Patient has no known allergies.   Review of Systems Review of Systems  Constitutional: Negative for chills and fever.  HENT: Negative for congestion and rhinorrhea.   Eyes: Negative for redness and visual disturbance.  Respiratory: Negative for shortness of breath and wheezing.   Cardiovascular: Negative for chest pain and palpitations.  Gastrointestinal: Positive for abdominal pain. Negative for nausea and vomiting.  Genitourinary: Negative for dysuria and urgency.  Musculoskeletal: Negative for arthralgias and myalgias.  Skin: Negative for pallor and wound.  Neurological: Negative for dizziness and headaches.     Physical Exam Updated Vital Signs BP (!) 141/98 (BP Location: Left Arm)   Pulse 68   Temp 98.4 F (36.9 C) (Oral)   Resp 18   Ht 5\' 2"  (1.575 m)   Wt 98.9 kg  (218 lb)   SpO2 98%   BMI 39.87 kg/m   Physical Exam  Constitutional: She is oriented to person, place, and time. She appears well-developed and well-nourished. No distress.  HENT:  Head: Normocephalic and atraumatic.  Eyes: Pupils are equal, round, and reactive to light. EOM are normal.  Neck: Normal range of motion. Neck supple.  Cardiovascular: Normal rate and regular rhythm. Exam reveals no gallop and no friction rub.  No murmur heard. Pulmonary/Chest: Effort normal. She has no wheezes. She has no rales.  Abdominal: Soft. She exhibits no distension. There is tenderness (mild RUQ, negative murphys) in the epigastric area.  Musculoskeletal: She exhibits no edema or tenderness.  Neurological: She is alert and oriented to person, place, and time.  Skin: Skin is warm and dry. She is not diaphoretic.  Psychiatric: She has a normal mood and affect. Her behavior is normal.  Nursing note and vitals reviewed.    ED Treatments / Results  Labs (all labs ordered are listed, but only abnormal results are displayed) Labs Reviewed  COMPREHENSIVE METABOLIC PANEL - Abnormal; Notable for the following components:      Result Value   Glucose, Bld 101 (*)    All other components within normal limits  CBC WITH DIFFERENTIAL/PLATELET  LIPASE, BLOOD    EKG None  Radiology No results found.  Procedures Procedures (including critical care time)  Medications Ordered in ED Medications  sodium chloride 0.9 % bolus 1,000 mL (1,000 mLs Intravenous New Bag/Given 05/31/18 1707)  morphine 4 MG/ML injection 4 mg (4 mg Intravenous Given 05/31/18 1707)  ondansetron (ZOFRAN) injection 4 mg (4 mg Intravenous Given 05/31/18 1707)  gi cocktail (Maalox,Lidocaine,Donnatal) (30 mLs Oral Given 05/31/18 1707)     Initial Impression / Assessment and Plan / ED Course  I have reviewed the triage vital signs and the nursing notes.  Pertinent labs & imaging results that were available during my care of the patient  were reviewed by me and considered in my medical decision making (see chart for details).     52 yo F with epigastric abdominal pain.  Will obtain labs, give pain meds, fluids, GI cocktail.  Reassess.   Patient feels better on reassessment.  Lipase is normal, LFTs are unremarkable, glucose is very mildly elevated.  She is able to tolerate p.o.  Suspect that this is reflux.  We will have her do Zantac or Pepcid.  PCP follow-up.  6:07 PM:  I have discussed the diagnosis/risks/treatment options with the patient and family and believe the pt to be eligible for discharge home to follow-up with PCP. We also discussed returning to the ED immediately if new or worsening sx occur.  We discussed the sx which are most concerning (e.g., sudden worsening pain, fever, inability to tolerate by mouth) that necessitate immediate return. Medications administered to the patient during their visit and any new prescriptions provided to the patient are listed below.  Medications given during this visit Medications  sodium chloride 0.9 % bolus 1,000 mL (1,000 mLs Intravenous New Bag/Given 05/31/18 1707)  morphine 4 MG/ML injection 4 mg (4 mg Intravenous Given 05/31/18 1707)  ondansetron (ZOFRAN) injection 4 mg (4 mg Intravenous Given 05/31/18 1707)  gi cocktail (Maalox,Lidocaine,Donnatal) (30 mLs Oral Given 05/31/18 1707)     The patient appears reasonably screen and/or stabilized for discharge and I doubt any other medical condition or other [facility] requiring further screening, evaluation, or treatment in the ED at this time prior to discharge.    Final Clinical Impressions(s) / ED Diagnoses   Final diagnoses:  Epigastric abdominal pain    ED Discharge Orders    None       Melene Plan, DO 05/31/18 1807

## 2018-05-31 NOTE — Discharge Instructions (Signed)
Try zantac or pepcid twice a day for the next couple weeks.  Try to avoid spicy foods tomato based products fatty foods chocolate peppermint alcohol and tobacco.  Return if you are unable to eat or drink anything if you get a fever or suddenly the pain gets much worse.

## 2018-05-31 NOTE — ED Triage Notes (Signed)
C/o abd pain started yeserday-daughter/interpreter-NAD-steady gait

## 2019-02-20 IMAGING — US US ABDOMEN COMPLETE
1 series · 14 of 25 positions shown · non-contrast
Comparison: CT from earlier in the same day

CLINICAL DATA: Acute pancreatitis

EXAM:
ABDOMEN ULTRASOUND COMPLETE

[Series 1: us abdomen complete · 0.27mm/px · 14 of 32 slices shown]
[im 1/32]
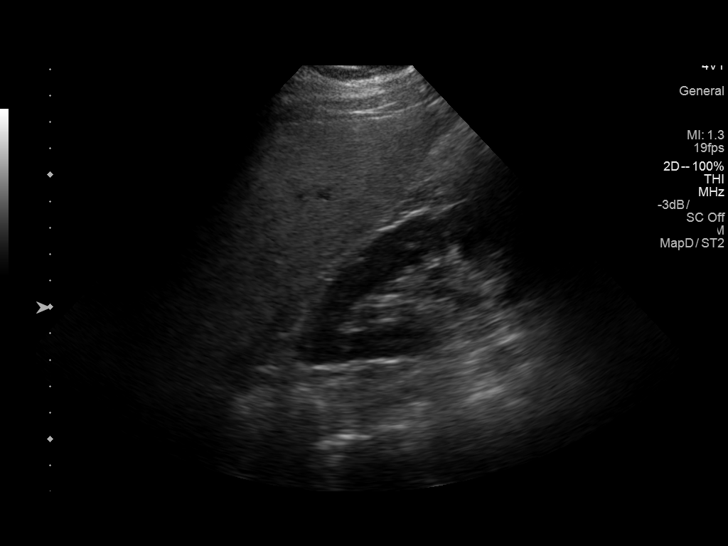
[im 3/32]
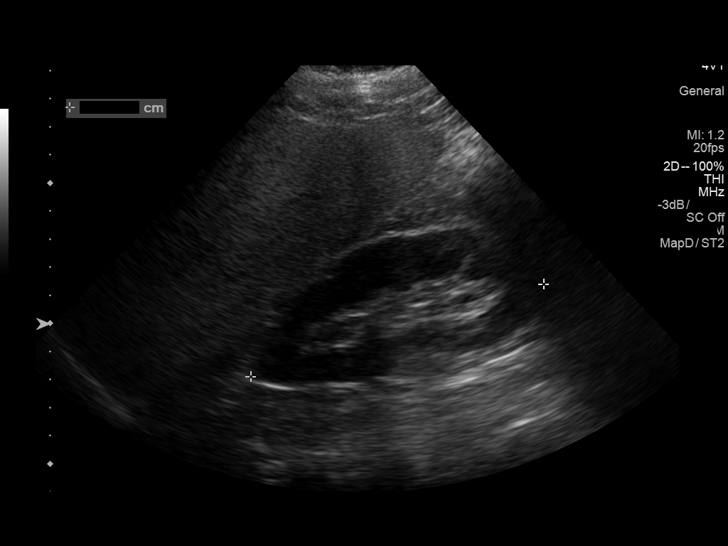
[im 6/32]
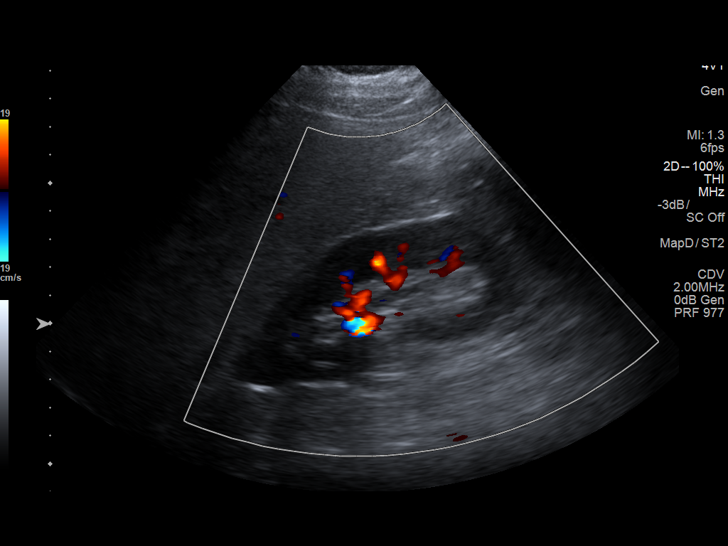
[im 8/32]
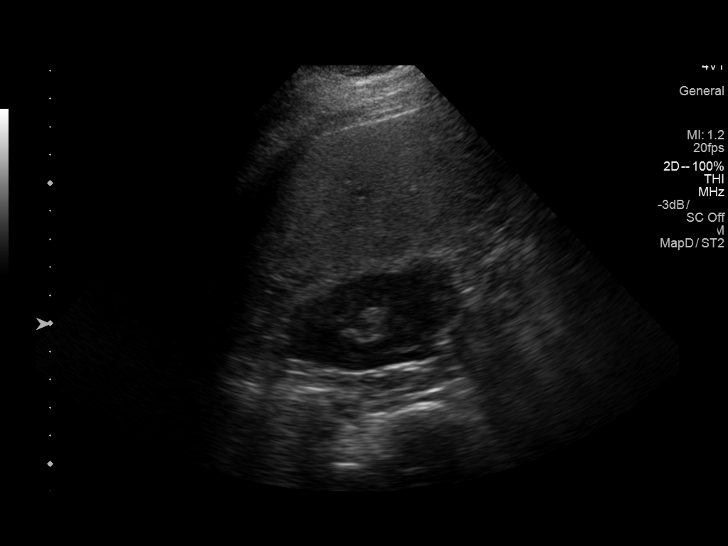
[im 11/32]
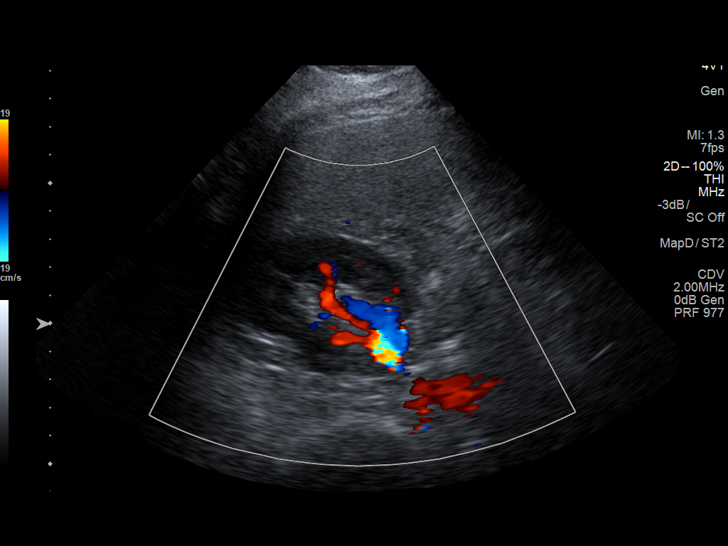
[im 12/32]
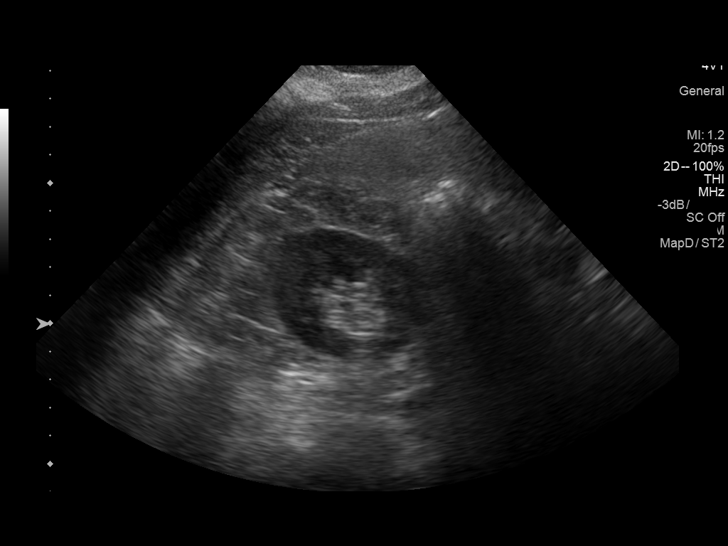
[im 15/32]
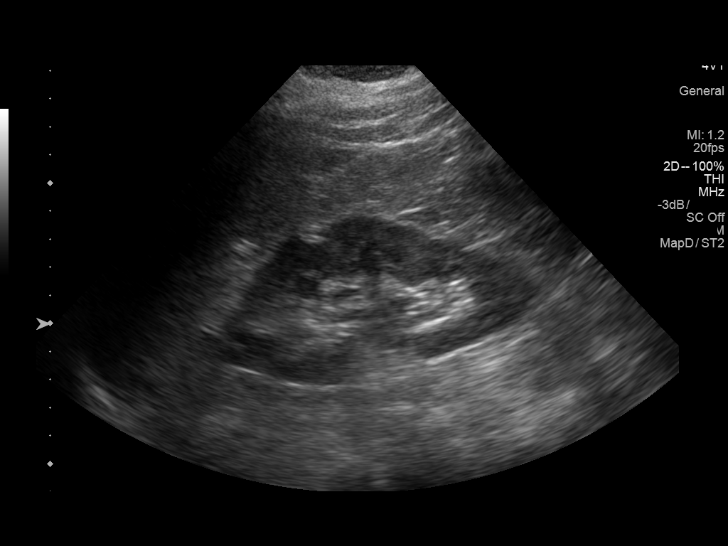
[im 17/32]
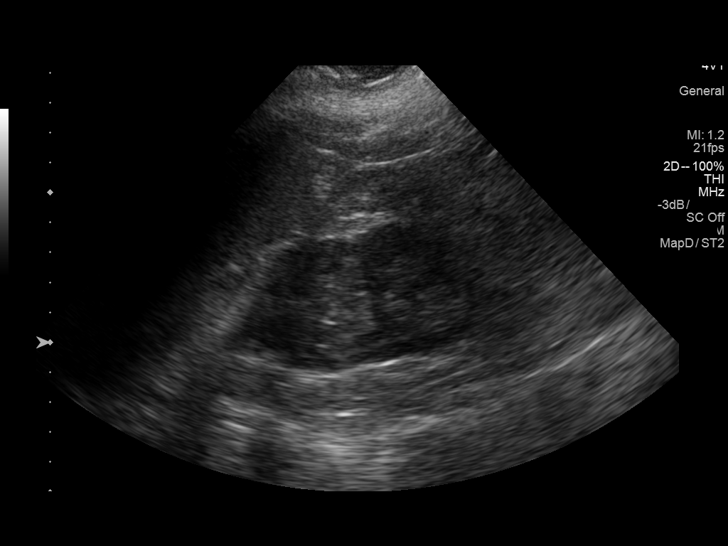
[im 20/32]
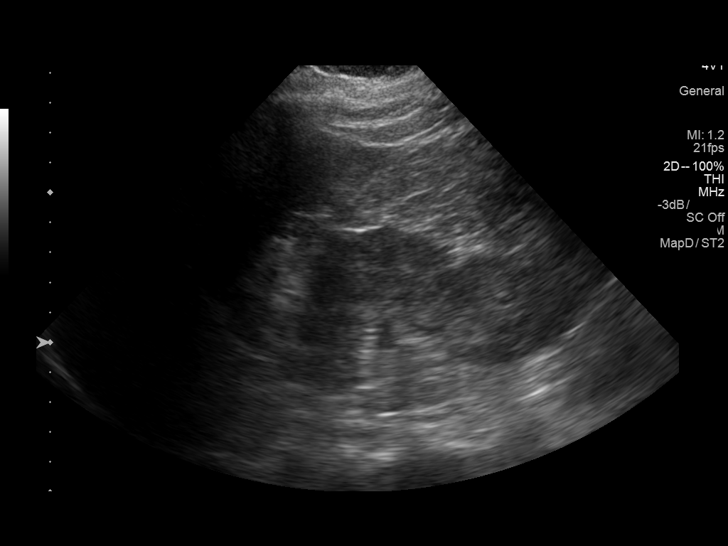
[im 21/32]
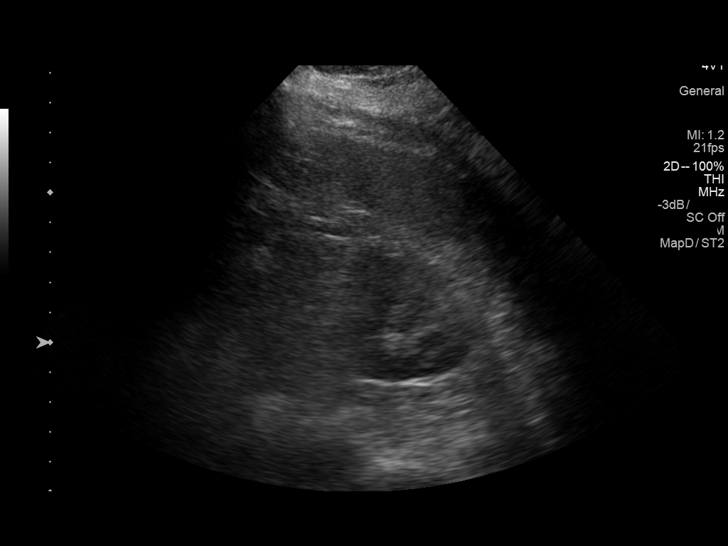
[im 24/32]
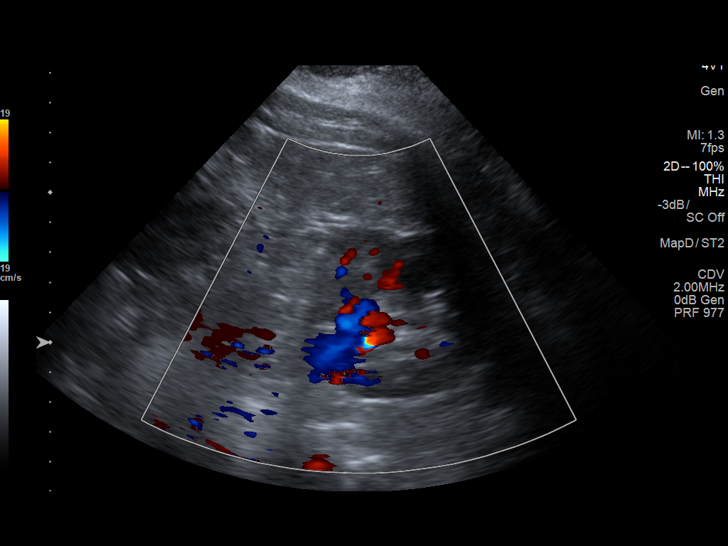
[im 26/32]
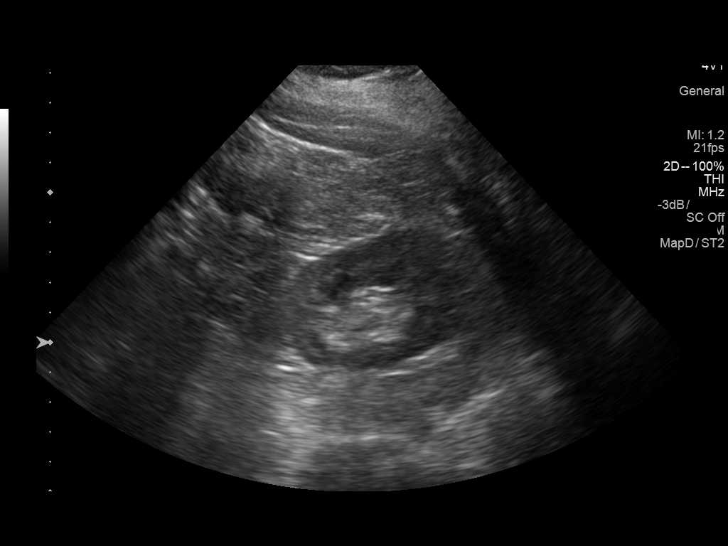
[im 29/32]
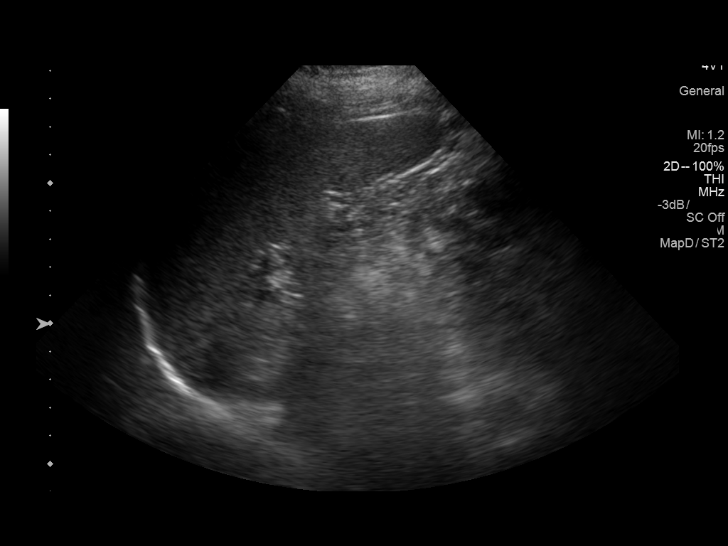
[im 32/32]
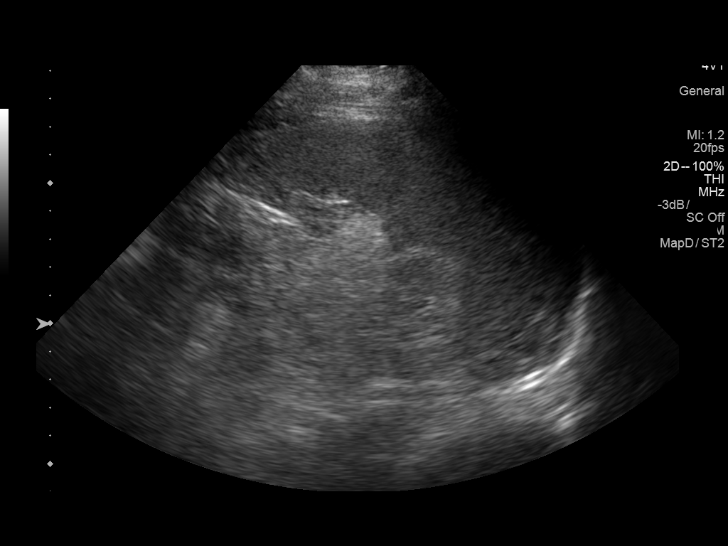

[14 of 25 positions shown; findings below may reference images not displayed]

FINDINGS: Gallbladder: Surgically removed

Common bile duct: Diameter: 8 mm

Liver: Increased in echogenicity consistent with fatty infiltration.
No focal mass lesion is noted.

IVC: No abnormality visualized.

Pancreas: Visualized portion is within normal limits. The head where
the area of acute pancreatitis is not well visualized due to
overlying bowel gas

Spleen: Size and appearance within normal limits.

Right Kidney: Length: 11.5 cm.. Echogenicity within normal limits.
No mass or hydronephrosis visualized.

Left Kidney: Length: 12.1 cm.. Echogenicity within normal limits. No
mass or hydronephrosis visualized.

Abdominal aorta: No aneurysm visualized.

Other findings: None.
IMPRESSION: Status post cholecystectomy.

Fatty liver.

The area of acute pancreatitis is not well appreciated on this exam.

## 2020-08-09 ENCOUNTER — Encounter (HOSPITAL_BASED_OUTPATIENT_CLINIC_OR_DEPARTMENT_OTHER): Payer: Self-pay | Admitting: *Deleted

## 2020-08-09 ENCOUNTER — Emergency Department (HOSPITAL_BASED_OUTPATIENT_CLINIC_OR_DEPARTMENT_OTHER)
Admission: EM | Admit: 2020-08-09 | Discharge: 2020-08-09 | Disposition: A | Discharge status: Home / Self Care | Payer: Medicare Other | Attending: Emergency Medicine | Admitting: Emergency Medicine

## 2020-08-09 ENCOUNTER — Other Ambulatory Visit: Payer: Self-pay

## 2020-08-09 DIAGNOSIS — K649 Unspecified hemorrhoids: Secondary | ICD-10-CM | POA: Diagnosis present

## 2020-08-09 DIAGNOSIS — K921 Melena: Secondary | ICD-10-CM | POA: Insufficient documentation

## 2020-08-09 DIAGNOSIS — R509 Fever, unspecified: Secondary | ICD-10-CM | POA: Diagnosis not present

## 2020-08-09 DIAGNOSIS — Z79899 Other long term (current) drug therapy: Secondary | ICD-10-CM | POA: Diagnosis not present

## 2020-08-09 MED ORDER — HYDROCORTISONE (PERIANAL) 2.5 % EX CREA: 1.0000 "application " | TOPICAL_CREAM | Freq: Two times a day (BID) | CUTANEOUS | 0 refills | Status: AC

## 2020-08-09 MED ORDER — POLYETHYLENE GLYCOL 3350 17 G PO PACK: 17.0000 g | PACK | Freq: Every day | ORAL | 0 refills | Status: AC

## 2020-08-09 NOTE — Discharge Instructions (Addendum)
  Qu bueno verte hoy! Lamento que las hemorroides te [employer]. tiene uno que est causando sus sntomas. He enviado una crema a su farmacia que puede aplicar en la zona afectada. Tambin le he recetado un laxante que ayudar a Physicist, medical. Haga un seguimiento con su mdico de cabecera la prxima semana.  Si el dolor o el sangrado [place], puede regresar al departamento de emergencias o al centro de atencin de [place].

## 2020-08-09 NOTE — ED Provider Notes (Signed)
[facility] EMERGENCY DEPARTMENT Provider Note   CSN: [id number] Arrival date & time: 08/09/20  1449     History Chief Complaint  Patient presents with  . Hemorrhoids    Virginia King is a 54 y.o. female.  Spanish speaking interpretor was used for this encounter.   Virginia King is a 54 yr old female with PMH depression and bells palsy presents today for hemorrhoids. Pt noticed lumps around her rectum 2 weeks ago. 2 of them burst a few days later. She can feel 1 lump remaining which is causing significant rectal pain. She has been unable to walk, sit or stand properly without feeling the pain. It hurts when she wipes her anus and also when defecating.She has been applying triple antibiotic cream which she bought over the counter. She did have rectal bleeding in the first week with the bumps that she noticed. The blood was dark red in color and separate to the stools. Pt denies constipation, diarrhea or melena.   Endorses fever to 105 last weekend for 2 days without other symptoms. Denies sick contacts or covid contacts. Has had her COVID vaccines.         Past Medical History:  Diagnosis Date  . Bell's palsy   . Depression     Patient Active Problem List   Diagnosis Date Noted  . Class 2 obesity due to excess calories with body mass index (BMI) of 36.0 to 36.9 in adult   . Idiopathic acute pancreatitis without infection or necrosis 02/02/2017  . Depression     Past Surgical History:  Procedure Laterality Date  . ABDOMINAL HYSTERECTOMY    . VAGINA SURGERY       OB History    Gravida  5   Para  3   Term  0   Preterm  0   AB      Living        SAB  0   TAB  0   Ectopic  0   Multiple      Live Births              History reviewed. No pertinent family history.  Social History   Tobacco Use  . Smoking status: Never Smoker  . Smokeless tobacco: Never Used  Substance Use Topics  . Alcohol use: Yes    Comment: occ  .  Drug use: No    Home Medications Prior to Admission medications   Medication Sig Start Date End Date Taking? Authorizing Provider  omeprazole (PRILOSEC) 20 MG capsule Take by mouth. 06/10/20 12/07/20 Yes [provider]  HYDROcodone-acetaminophen (NORCO/VICODIN) 5-325 MG tablet Take 1-2 tablets by mouth every 6 (six) hours as needed for severe pain. 02/03/17   Vassie Loll, MD  hydrocortisone (ANUSOL-HC) 2.5 % rectal cream Place 1 application rectally 2 (two) times daily. 08/09/20   Towanda Octave, MD  ibuprofen (ADVIL,MOTRIN) 200 MG tablet Take 2 tablets (400 mg total) by mouth every 8 (eight) hours as needed for fever, headache or moderate pain. 02/03/17   Vassie Loll, MD  ondansetron (ZOFRAN ODT) 8 MG disintegrating tablet Take 1 tablet (8 mg total) by mouth every 8 (eight) hours as needed for nausea or vomiting. 02/03/17   Vassie Loll, MD  paliperidone (INVEGA) 9 MG 24 hr tablet Take by mouth.    [provider]  polyethylene glycol (MIRALAX) 17 g packet Take 17 g by mouth daily. 08/09/20   Towanda Octave, MD  sertraline (ZOLOFT) 100 MG tablet  Take by mouth.    [provider]  triamcinolone acetonide (TRIESENCE) 40 MG/ML SUSP Inject into the articular space. 09/10/17   [provider]  zolpidem (AMBIEN) 10 MG tablet Take by mouth.    [provider]    Allergies    Patient has no known allergies.  Review of Systems   Review of Systems  Constitutional: Positive for fever.  Gastrointestinal: Positive for anal bleeding, blood in stool and rectal pain. Negative for abdominal distention, abdominal pain, constipation, diarrhea and nausea.  Skin: Negative for pallor and rash.    Physical Exam Updated Vital Signs BP (!) 150/83 (BP Location: Left Arm)   Pulse 84   Temp 98.1 F (36.7 C) (Oral)   Resp 17   Ht 5\' 3"  (1.6 m)   Wt 63.5 kg   SpO2 99%   BMI 24.80 kg/m   Physical Exam Constitutional:      General: She is in acute distress.      Appearance: She is obese. She is not ill-appearing.  HENT:     Head: Normocephalic and atraumatic.  Pulmonary:     Effort: Pulmonary effort is normal.  Genitourinary:   Skin:    General: Skin is warm and dry.  Neurological:     Mental Status: She is alert.     ED Results / Procedures / Treatments   Labs (all labs ordered are listed, but only abnormal results are displayed) Labs Reviewed - No data to display  EKG None  Radiology No results found.  Procedures Procedures (including critical care time)  Medications Ordered in ED Medications - No data to display  ED Course  I have reviewed the triage vital signs and the nursing notes.  Pertinent labs & imaging results that were available during my care of the patient were reviewed by me and considered in my medical decision making (see chart for details).    MDM Rules/Calculators/A&P                          Mike Berntsen is a 54 yr old female with PMH schizophrenia presents today for 2 week history of rectal pain. Examination consistent with external hemorrhoid today. No evidence of thrombosis of hemorrhoids or anal fissues. Discharged with anusol cream and miralax. Recommended follow up with PCP next week if still symptomatic.     Final Clinical Impression(s) / ED Diagnoses Final diagnoses:  Hemorrhoids, unspecified hemorrhoid type    Rx / DC Orders ED Discharge Orders         Ordered    hydrocortisone (ANUSOL-HC) 2.5 % rectal cream  2 times daily        08/09/20 1926    polyethylene glycol (MIRALAX) 17 g packet  Daily        08/09/20 1926           10/09/20, MD 08/09/20 1953    10/09/20, MD 08/11/20 2300

## 2020-08-09 NOTE — ED Triage Notes (Signed)
Pain to defecate for 15 days.  Patient as per daughter's interpretation, patient has hemorrhoids.

## 2020-10-07 ENCOUNTER — Ambulatory Visit: Payer: Medicare Other | Admitting: Medical

## 2020-11-28 ENCOUNTER — Ambulatory Visit: Payer: Medicare Other | Admitting: Medical

## 2021-02-02 ENCOUNTER — Emergency Department (HOSPITAL_BASED_OUTPATIENT_CLINIC_OR_DEPARTMENT_OTHER): Payer: Medicare Other

## 2021-02-02 ENCOUNTER — Inpatient Hospital Stay (HOSPITAL_BASED_OUTPATIENT_CLINIC_OR_DEPARTMENT_OTHER)
Admission: EM | Admit: 2021-02-02 | Discharge: 2021-02-05 | DRG: 439 | Disposition: A | Discharge status: Home / Self Care | Payer: Medicare Other | Attending: Internal Medicine | Admitting: Internal Medicine

## 2021-02-02 ENCOUNTER — Other Ambulatory Visit: Payer: Self-pay

## 2021-02-02 ENCOUNTER — Encounter (HOSPITAL_BASED_OUTPATIENT_CLINIC_OR_DEPARTMENT_OTHER): Payer: Self-pay

## 2021-02-02 DIAGNOSIS — K859 Acute pancreatitis without necrosis or infection, unspecified: Principal | ICD-10-CM | POA: Diagnosis present

## 2021-02-02 DIAGNOSIS — Z6841 Body Mass Index (BMI) 40.0 and over, adult: Secondary | ICD-10-CM

## 2021-02-02 DIAGNOSIS — K219 Gastro-esophageal reflux disease without esophagitis: Secondary | ICD-10-CM | POA: Diagnosis present

## 2021-02-02 DIAGNOSIS — I10 Essential (primary) hypertension: Secondary | ICD-10-CM | POA: Diagnosis present

## 2021-02-02 DIAGNOSIS — Z9049 Acquired absence of other specified parts of digestive tract: Secondary | ICD-10-CM | POA: Diagnosis not present

## 2021-02-02 DIAGNOSIS — E785 Hyperlipidemia, unspecified: Secondary | ICD-10-CM | POA: Diagnosis present

## 2021-02-02 DIAGNOSIS — Z20822 Contact with and (suspected) exposure to covid-19: Secondary | ICD-10-CM | POA: Diagnosis present

## 2021-02-02 DIAGNOSIS — K760 Fatty (change of) liver, not elsewhere classified: Secondary | ICD-10-CM | POA: Diagnosis present

## 2021-02-02 DIAGNOSIS — F259 Schizoaffective disorder, unspecified: Secondary | ICD-10-CM | POA: Diagnosis present

## 2021-02-02 DIAGNOSIS — K838 Other specified diseases of biliary tract: Secondary | ICD-10-CM | POA: Diagnosis present

## 2021-02-02 DIAGNOSIS — Z79899 Other long term (current) drug therapy: Secondary | ICD-10-CM | POA: Diagnosis not present

## 2021-02-02 DIAGNOSIS — F4310 Post-traumatic stress disorder, unspecified: Secondary | ICD-10-CM | POA: Diagnosis present

## 2021-02-02 DIAGNOSIS — Z90710 Acquired absence of both cervix and uterus: Secondary | ICD-10-CM

## 2021-02-02 DIAGNOSIS — M1990 Unspecified osteoarthritis, unspecified site: Secondary | ICD-10-CM | POA: Diagnosis present

## 2021-02-02 DIAGNOSIS — R933 Abnormal findings on diagnostic imaging of other parts of digestive tract: Secondary | ICD-10-CM

## 2021-02-02 LAB — CBC WITH DIFFERENTIAL/PLATELET
Abs Immature Granulocytes: 0.03 10*3/uL (ref 0.00–0.07)
Basophils Absolute: 0 10*3/uL (ref 0.0–0.1)
Basophils Relative: 0 %
Eosinophils Absolute: 0.1 10*3/uL (ref 0.0–0.5)
Eosinophils Relative: 1 %
HCT: 44.1 % (ref 36.0–46.0)
Hemoglobin: 14.9 g/dL (ref 12.0–15.0)
Immature Granulocytes: 0 %
Lymphocytes Relative: 11 %
Lymphs Abs: 1.3 10*3/uL (ref 0.7–4.0)
MCH: 29.3 pg (ref 26.0–34.0)
MCHC: 33.8 g/dL (ref 30.0–36.0)
MCV: 86.6 fL (ref 80.0–100.0)
Monocytes Absolute: 0.7 10*3/uL (ref 0.1–1.0)
Monocytes Relative: 6 %
Neutro Abs: 9.4 10*3/uL — ABNORMAL HIGH (ref 1.7–7.7)
Neutrophils Relative %: 82 %
Platelets: 290 10*3/uL (ref 150–400)
RBC: 5.09 MIL/uL (ref 3.87–5.11)
RDW: 12.7 % (ref 11.5–15.5)
WBC: 11.5 10*3/uL — ABNORMAL HIGH (ref 4.0–10.5)
nRBC: 0 % (ref 0.0–0.2)

## 2021-02-02 LAB — LIPID PANEL
Cholesterol: 201 mg/dL — ABNORMAL HIGH (ref 0–200)
HDL: 41 mg/dL (ref >40)
LDL Cholesterol: 133 mg/dL — ABNORMAL HIGH (ref 0–99)
Total CHOL/HDL Ratio: 4.9 RATIO
Triglycerides: 134 mg/dL (ref <150)
VLDL: 27 mg/dL (ref 0–40)

## 2021-02-02 LAB — COMPREHENSIVE METABOLIC PANEL
ALT: 25 U/L (ref 0–44)
AST: 35 U/L (ref 15–41)
Albumin: 4.4 g/dL (ref 3.5–5.0)
Alkaline Phosphatase: 94 U/L (ref 38–126)
Anion gap: 13 (ref 5–15)
BUN: 18 mg/dL (ref 6–20)
CO2: 24 mmol/L (ref 22–32)
Calcium: 9.5 mg/dL (ref 8.9–10.3)
Chloride: 99 mmol/L (ref 98–111)
Creatinine, Ser: 1.07 mg/dL — ABNORMAL HIGH (ref 0.44–1.00)
GFR, Estimated: >60 mL/min (ref >60)
Glucose, Bld: 133 mg/dL — ABNORMAL HIGH (ref 70–99)
Potassium: 4.2 mmol/L (ref 3.5–5.1)
Sodium: 136 mmol/L (ref 135–145)
Total Bilirubin: 0.5 mg/dL (ref 0.3–1.2)
Total Protein: 7.8 g/dL (ref 6.5–8.1)

## 2021-02-02 LAB — CBC
HCT: 43 % (ref 36.0–46.0)
Hemoglobin: 14.1 g/dL (ref 12.0–15.0)
MCH: 29.1 pg (ref 26.0–34.0)
MCHC: 32.8 g/dL (ref 30.0–36.0)
MCV: 88.8 fL (ref 80.0–100.0)
Platelets: 278 10*3/uL (ref 150–400)
RBC: 4.84 MIL/uL (ref 3.87–5.11)
RDW: 13.2 % (ref 11.5–15.5)
WBC: 10.6 10*3/uL — ABNORMAL HIGH (ref 4.0–10.5)
nRBC: 0 % (ref 0.0–0.2)

## 2021-02-02 LAB — URINALYSIS, ROUTINE W REFLEX MICROSCOPIC
Bilirubin Urine: NEGATIVE
Glucose, UA: NEGATIVE mg/dL
Hgb urine dipstick: NEGATIVE
Ketones, ur: NEGATIVE mg/dL
Leukocytes,Ua: NEGATIVE
Nitrite: NEGATIVE
Protein, ur: NEGATIVE mg/dL
Specific Gravity, Urine: 1.02 (ref 1.005–1.030)
pH: 7 (ref 5.0–8.0)

## 2021-02-02 LAB — RESP PANEL BY RT-PCR (FLU A&B, COVID) ARPGX2
Influenza A by PCR: NEGATIVE
Influenza B by PCR: NEGATIVE
SARS Coronavirus 2 by RT PCR: NEGATIVE

## 2021-02-02 LAB — LIPASE, BLOOD: Lipase: 390 U/L — ABNORMAL HIGH (ref 11–51)

## 2021-02-02 LAB — CREATININE, SERUM
Creatinine, Ser: 1.01 mg/dL — ABNORMAL HIGH (ref 0.44–1.00)
GFR, Estimated: >60 mL/min (ref >60)

## 2021-02-02 LAB — HIV ANTIBODY (ROUTINE TESTING W REFLEX): HIV Screen 4th Generation wRfx: NONREACTIVE

## 2021-02-02 MED ORDER — ONDANSETRON HCL 4 MG/2ML IJ SOLN
4.0000 mg | Freq: Once | INTRAMUSCULAR | Status: AC
  Administered 2021-02-02: 4 mg via INTRAVENOUS
  Filled 2021-02-02: qty 2

## 2021-02-02 MED ORDER — IOHEXOL 300 MG/ML  SOLN
100.0000 mL | Freq: Once | INTRAMUSCULAR | Status: AC | PRN
  Administered 2021-02-02: 100 mL via INTRAVENOUS

## 2021-02-02 MED ORDER — DOCUSATE SODIUM 100 MG PO CAPS
100.0000 mg | ORAL_CAPSULE | Freq: Two times a day (BID) | ORAL | Status: DC
  Administered 2021-02-02 – 2021-02-05 (×6): 100 mg via ORAL
  Filled 2021-02-02 (×7): qty 1

## 2021-02-02 MED ORDER — HYDROCODONE-ACETAMINOPHEN 5-325 MG PO TABS
1.0000 | ORAL_TABLET | ORAL | Status: DC | PRN
  Administered 2021-02-02: 2 via ORAL
  Filled 2021-02-02: qty 2

## 2021-02-02 MED ORDER — SERTRALINE HCL 100 MG PO TABS
100.0000 mg | ORAL_TABLET | Freq: Every day | ORAL | Status: DC
Start: 2021-02-02 — End: 2021-02-05
  Administered 2021-02-02 – 2021-02-05 (×4): 100 mg via ORAL
  Filled 2021-02-02 (×4): qty 1

## 2021-02-02 MED ORDER — PANTOPRAZOLE SODIUM 40 MG IV SOLR
40.0000 mg | Freq: Once | INTRAVENOUS | Status: AC
Start: 2021-02-02 — End: 2021-02-02
  Administered 2021-02-02: 40 mg via INTRAVENOUS
  Filled 2021-02-02: qty 40

## 2021-02-02 MED ORDER — FENTANYL CITRATE (PF) 100 MCG/2ML IJ SOLN
50.0000 ug | Freq: Once | INTRAMUSCULAR | Status: AC
  Administered 2021-02-02: 50 ug via INTRAVENOUS
  Filled 2021-02-02: qty 2

## 2021-02-02 MED ORDER — SODIUM CHLORIDE 0.9 % IV SOLN: INTRAVENOUS | Status: DC

## 2021-02-02 MED ORDER — ONDANSETRON HCL 4 MG/2ML IJ SOLN: 4.0000 mg | Freq: Four times a day (QID) | INTRAMUSCULAR | Status: DC | PRN

## 2021-02-02 MED ORDER — ENOXAPARIN SODIUM 40 MG/0.4ML ~~LOC~~ SOLN
40.0000 mg | SUBCUTANEOUS | Status: DC
  Administered 2021-02-02 – 2021-02-04 (×3): 40 mg via SUBCUTANEOUS
  Filled 2021-02-02 (×4): qty 0.4

## 2021-02-02 MED ORDER — HYDROMORPHONE HCL 1 MG/ML IJ SOLN
0.5000 mg | INTRAMUSCULAR | Status: DC | PRN
  Administered 2021-02-02 (×2): 1 mg via INTRAVENOUS
  Filled 2021-02-02 (×2): qty 1

## 2021-02-02 MED ORDER — ZOLPIDEM TARTRATE 5 MG PO TABS
5.0000 mg | ORAL_TABLET | Freq: Every evening | ORAL | Status: DC | PRN
  Administered 2021-02-03: 5 mg via ORAL
  Filled 2021-02-02: qty 1

## 2021-02-02 MED ORDER — FENTANYL CITRATE (PF) 100 MCG/2ML IJ SOLN
100.0000 ug | Freq: Once | INTRAMUSCULAR | Status: AC
  Administered 2021-02-02: 100 ug via INTRAVENOUS
  Filled 2021-02-02: qty 2

## 2021-02-02 MED ORDER — PALIPERIDONE ER 6 MG PO TB24
9.0000 mg | ORAL_TABLET | Freq: Every day | ORAL | Status: DC
  Administered 2021-02-02 – 2021-02-05 (×4): 9 mg via ORAL
  Filled 2021-02-02 (×4): qty 1

## 2021-02-02 MED ORDER — ACETAMINOPHEN 325 MG PO TABS: 650.0000 mg | ORAL_TABLET | Freq: Four times a day (QID) | ORAL | Status: DC | PRN

## 2021-02-02 MED ORDER — ONDANSETRON HCL 4 MG PO TABS: 4.0000 mg | ORAL_TABLET | Freq: Four times a day (QID) | ORAL | Status: DC | PRN

## 2021-02-02 MED ORDER — LACTATED RINGERS IV BOLUS
1000.0000 mL | Freq: Once | INTRAVENOUS | Status: AC
  Administered 2021-02-02: 1000 mL via INTRAVENOUS

## 2021-02-02 MED ORDER — ACETAMINOPHEN 650 MG RE SUPP: 650.0000 mg | Freq: Four times a day (QID) | RECTAL | Status: DC | PRN

## 2021-02-02 NOTE — ED Notes (Signed)
Pt placed on 2L [place] due to pain medication; no changes in respirations or signs of distress; EDP aware

## 2021-02-02 NOTE — ED Notes (Signed)
EDP at bedside  

## 2021-02-02 NOTE — ED Triage Notes (Signed)
ED Presents POV from Home with ABD Pain.  Pain is Epigastric and occurred since 1700 yesterday after eating. Patient attempted drinking tea with cinnamon with no relief.   Patient attempted to sleep but pain worsened.  Pain is constant and intermittently worse. Pain is also positional (worsens when flat or on side).

## 2021-02-02 NOTE — ED Notes (Signed)
Called [facility] to transport patient to [facility] [address] Rm 1529

## 2021-02-02 NOTE — ED Notes (Signed)
Attempted to call pt daughters per pt request, no answer; left voicemail asking to call back; pt has cell-phone and was given her room number and number for unit

## 2021-02-02 NOTE — ED Provider Notes (Signed)
[facility] Provider Note: [name], MD, FACEP  CSN: [id number] MRN: [id number] ARRIVAL: 02/02/21 at 0042 ROOM: MH09/MH09   CHIEF COMPLAINT  Abdominal Pain   HISTORY OF PRESENT ILLNESS  02/02/21 12:58 AM Virginia King is a 55 y.o. female with epigastric pain that began after eating about 5 PM yesterday afternoon.  The pain has been waxing and waning and is been a 10 out of 10 at times.  It is worse with movement or palpation.  She denies associated nausea, vomiting or diarrhea.  She drinks cinnamon tea without relief.  Pain is worse also when she lies on her side.  The pain radiates into her back.  She denies being a heavy drinker.   Past Medical History:  Diagnosis Date  . Bell's palsy   . Depression     Past Surgical History:  Procedure Laterality Date  . ABDOMINAL HYSTERECTOMY    . VAGINA SURGERY      No family history on file.  Social History   Tobacco Use  . Smoking status: Never Smoker  . Smokeless tobacco: Never Used  Substance Use Topics  . Alcohol use: Yes    Comment: occ  . Drug use: No    Prior to Admission medications   Medication Sig Start Date End Date Taking? Authorizing Provider  HYDROcodone-acetaminophen (NORCO/VICODIN) 5-325 MG tablet Take 1-2 tablets by mouth every 6 (six) hours as needed for severe pain. 02/03/17   Vassie Loll, MD  hydrocortisone (ANUSOL-HC) 2.5 % rectal cream Place 1 application rectally 2 (two) times daily. 08/09/20   Towanda Octave, MD  ibuprofen (ADVIL,MOTRIN) 200 MG tablet Take 2 tablets (400 mg total) by mouth every 8 (eight) hours as needed for fever, headache or moderate pain. 02/03/17   Vassie Loll, MD  omeprazole (PRILOSEC) 20 MG capsule Take by mouth. 06/10/20 12/07/20  [provider]  ondansetron (ZOFRAN ODT) 8 MG disintegrating tablet Take 1 tablet (8 mg total) by mouth every 8 (eight) hours as needed for nausea or vomiting. 02/03/17   Vassie Loll, MD  paliperidone (INVEGA) 9 MG 24  hr tablet Take by mouth.    [provider]  polyethylene glycol (MIRALAX) 17 g packet Take 17 g by mouth daily. 08/09/20   Towanda Octave, MD  sertraline (ZOLOFT) 100 MG tablet Take by mouth.    [provider]  triamcinolone acetonide (TRIESENCE) 40 MG/ML SUSP Inject into the articular space. 09/10/17   [provider]  zolpidem (AMBIEN) 10 MG tablet Take by mouth.    [provider]    Allergies Patient has no known allergies.   REVIEW OF SYSTEMS  Negative except as noted here or in the History of Present Illness.   PHYSICAL EXAMINATION  Initial Vital Signs Blood pressure 123/76, pulse 93, temperature 99.7 F (37.6 C), temperature source Oral, resp. rate 18, height 5\' 3"  (1.6 m), weight 108.9 kg, SpO2 95 %.  Examination General: Well-developed, well-nourished female in no acute distress; appearance consistent with age of record HENT: normocephalic; atraumatic Eyes: pupils equal, round and reactive to light; extraocular muscles intact Neck: supple Heart: regular rate and rhythm Lungs: clear to auscultation bilaterally Abdomen: soft; nondistended; upper abdominal tenderness including the epigastrium, RUQ and LUQ; no masses or hepatosplenomegaly; bowel sounds present Extremities: No deformity; full range of motion; pulses normal Neurologic: Awake, alert and oriented; motor function intact in all extremities and symmetric; no facial droop Skin: Warm and dry Psychiatric: Normal mood and affect   RESULTS  Summary of this visit's results, reviewed and interpreted by myself:   EKG Interpretation  Date/Time:    Ventricular Rate:    PR Interval:    QRS Duration:   QT Interval:    QTC Calculation:   R Axis:     Text Interpretation:        Laboratory Studies: Results for orders placed or performed during the hospital encounter of 02/02/21 (from the past 24 hour(s))  CBC with Differential/Platelet     Status: Abnormal   Collection Time:  02/02/21  1:24 AM  Result Value Ref Range   WBC 11.5 (H) 4.0 - 10.5 K/uL   RBC 5.09 3.87 - 5.11 MIL/uL   Hemoglobin 14.9 12.0 - 15.0 g/dL   HCT 80.9 98.3 - 38.2 %   MCV 86.6 80.0 - 100.0 fL   MCH 29.3 26.0 - 34.0 pg   MCHC 33.8 30.0 - 36.0 g/dL   RDW 50.5 39.7 - 67.3 %   Platelets 290 150 - 400 K/uL   nRBC 0.0 0.0 - 0.2 %   Neutrophils Relative % 82 %   Neutro Abs 9.4 (H) 1.7 - 7.7 K/uL   Lymphocytes Relative 11 %   Lymphs Abs 1.3 0.7 - 4.0 K/uL   Monocytes Relative 6 %   Monocytes Absolute 0.7 0.1 - 1.0 K/uL   Eosinophils Relative 1 %   Eosinophils Absolute 0.1 0.0 - 0.5 K/uL   Basophils Relative 0 %   Basophils Absolute 0.0 0.0 - 0.1 K/uL   Immature Granulocytes 0 %   Abs Immature Granulocytes 0.03 0.00 - 0.07 K/uL  Comprehensive metabolic panel     Status: Abnormal   Collection Time: 02/02/21  1:24 AM  Result Value Ref Range   Sodium 136 135 - 145 mmol/L   Potassium 4.2 3.5 - 5.1 mmol/L   Chloride 99 98 - 111 mmol/L   CO2 24 22 - 32 mmol/L   Glucose, Bld 133 (H) 70 - 99 mg/dL   BUN 18 6 - 20 mg/dL   Creatinine, Ser 4.19 (H) 0.44 - 1.00 mg/dL   Calcium 9.5 8.9 - 37.9 mg/dL   Total Protein 7.8 6.5 - 8.1 g/dL   Albumin 4.4 3.5 - 5.0 g/dL   AST 35 15 - 41 U/L   ALT 25 0 - 44 U/L   Alkaline Phosphatase 94 38 - 126 U/L   Total Bilirubin 0.5 0.3 - 1.2 mg/dL   GFR, Estimated >02 >40 mL/min   Anion gap 13 5 - 15  Lipase, blood     Status: Abnormal   Collection Time: 02/02/21  1:24 AM  Result Value Ref Range   Lipase 390 (H) 11 - 51 U/L  Urinalysis, Routine w reflex microscopic     Status: None   Collection Time: 02/02/21  1:24 AM  Result Value Ref Range   Color, Urine YELLOW YELLOW   APPearance CLEAR CLEAR   Specific Gravity, Urine 1.020 1.005 - 1.030   pH 7.0 5.0 - 8.0   Glucose, UA NEGATIVE NEGATIVE mg/dL   Hgb urine dipstick NEGATIVE NEGATIVE   Bilirubin Urine NEGATIVE NEGATIVE   Ketones, ur NEGATIVE NEGATIVE mg/dL   Protein, ur NEGATIVE NEGATIVE mg/dL    Nitrite NEGATIVE NEGATIVE   Leukocytes,Ua NEGATIVE NEGATIVE   Imaging Studies: CT ABDOMEN PELVIS W CONTRAST  Result Date: 02/02/2021 CLINICAL DATA:  Pancreatitis suspected epigastric pain since 1700 hours yesterday after eating EXAM: CT ABDOMEN AND PELVIS WITH CONTRAST TECHNIQUE: Multidetector CT imaging of the abdomen and  pelvis was performed using the standard protocol following bolus administration of intravenous contrast. CONTRAST:  100mL OMNIPAQUE IOHEXOL 300 MG/ML  SOLN COMPARISON:  CT 02/02/2017 FINDINGS: Lower chest: Bandlike and ground-glass opacities in the lung bases likely reflect atelectatic change. Lung bases otherwise clear. Trace pericardial fluid is similar to comparison. Normal cardiac size. Hepatobiliary: Diffuse hepatic hypoattenuation compatible with hepatic steatosis. No concerning focal liver lesion. Gallbladder appears surgically absent. No significant biliary ductal dilatation or visible intraductal gallstones Pancreas: Some mild stranding centered predominantly along the pancreatic head and uncinate may be related to the pancreas itself or secondary to a duodenal process, difficult to fully ascertain. Relative sparing of the more distal pancreatic body and tail. No pancreatic ductal dilatation. Uniform parenchymal enhancement of the pancreas. Some minimal fatty infiltration. Spleen: Normal in size. No concerning splenic lesions. Adrenals/Urinary Tract: Normal adrenals. Kidneys are normally located with symmetric enhancement and excretion. No suspicious renal lesion, urolithiasis or hydronephrosis. Urinary bladder is largely decompressed at the time of exam and therefore poorly evaluated by CT imaging. No gross bladder abnormality accounting for underdistention. Stomach/Bowel: Small sliding-type hiatal hernia. Stomach is grossly unremarkable. There is some mild mural thickening and possible mucosal enhancement through the second and third portion of the duodenal sweep. Could reflect a  primary duodenitis or reactive change adjacent to the pancreas. Remaining small bowel is free of significant thickening or dilatation. A normal appendix is visualized. No colonic dilatation or wall thickening. Vascular/Lymphatic: Some minimal stranding around the SMA at the root, possibly related to the pancreatic versus duodenal process detailed above. No acute luminal abnormality or other significant vascular findings are present. No enlarged abdominal or pelvic lymph nodes. Reproductive: Uterus appears to be surgically absent. Stable likely benign calcification in the left ovary. No concerning adnexal masses or lesions. Other: Inflammatory changes and trace likely inflammatory free fluid in the retroperitoneum. No free air, organized collection or abscess is seen. No bowel containing hernias. Musculoskeletal: Multilevel degenerative changes are present in the imaged portions of the spine. 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 Mild degenerative changes in the hips and pelvis. No acute osseous abnormality or suspicious osseous lesion. IMPRESSION: 1. Some mild stranding centered predominantly along the pancreatic head and uncinate may be related to the pancreas itself or secondary to a duodenal process given some mild thickening of the 2nd to 3rd portion of the duodenal sweep, difficult to fully ascertain. Stranding and inflammation is seen extending into the retroperitoneum and about the SMA at the root. No free air, organized collection or abscess is seen. Recommend further evaluation with lipase with upper endoscopy as clinically warranted. 2. Hepatic steatosis. 3. Small sliding-type hiatal hernia. Electronically Signed   By: Kreg ShropshirePrice  DeHay M.D.   On: 02/02/2021 03:57    ED COURSE and MDM  Nursing notes, initial and subsequent vitals signs, including pulse oximetry, reviewed and interpreted by myself.  Vitals:   02/02/21 0051 02/02/21 0200 02/02/21 0345  BP: 123/76 97/61 103/62  Pulse: 93 90 76  Resp:  18 (!) 21 18  Temp: 99.7 F (37.6 C)    TempSrc: Oral    SpO2: 95% 92% 92%  Weight: 108.9 kg    Height: 5\' 3"  (1.6 m)     Medications  ondansetron (ZOFRAN) injection 4 mg (4 mg Intravenous Given 02/02/21 0125)  fentaNYL (SUBLIMAZE) injection 100 mcg (100 mcg Intravenous Given 02/02/21 0125)  pantoprazole (PROTONIX) injection 40 mg (40 mg Intravenous Given 02/02/21 0124)  iohexol (OMNIPAQUE) 300 MG/ML solution 100 mL (  100 mLs Intravenous Contrast Given 02/02/21 0326)   4:04 AM Clinical presentation, lipase and CT findings are consistent with acute pancreatitis.  Will have patient admitted for further evaluation.   PROCEDURES  Procedures   ED DIAGNOSES     ICD-10-CM   1. Acute pancreatitis without infection or necrosis, unspecified pancreatitis type  K85.90        Lira Stephen, Jonny Ruiz, MD 02/02/21 [phone]

## 2021-02-02 NOTE — H&P (Addendum)
History and Physical    Virginia King MLY:[id number] DOB: [birth year] DOA: 02/02/2021  PCP: [facility]  Patient coming from: [facility]  Chief Complaint: abdominal pain  HPI: Virginia King is a 55 y.o. female with medical history significant of arthritis, schizoaffective disorder, PTSD, morbid obesity, HTN, HLD. Presenting with abdominal pain. History through aid of interpreter as patient is Spanish speaking only. Her stomach pain started around 5p yesterday evening. It was a sharp pain across the upper half of her stomach. It came in waves of increasing intensity. She tried taking some cinnamon tea, but it did not help. As the pain increased to 10/10; she became concerned and came to the ED. She denies any other aggravating or alleviating factors.   ED Course: CT showed some stranding around pancreas head. [place] ab showed mildly dilated CBD. Pt found to have an elevated lipase. Patient was given pain meds and fluids. TRH was called for admission.   Review of Systems:  Denies CP, palpitations, dyspnea, V/D/F. Reports nausea. Review of systems is otherwise negative for all not mentioned in HPI.   PMHx Past Medical History:  Diagnosis Date  . Bell's palsy   . Depression   HTN HLD Arthritis  PSHx Past Surgical History:  Procedure Laterality Date  . ABDOMINAL HYSTERECTOMY    . VAGINA SURGERY      SocHx  reports that she has never smoked. She has never used smokeless tobacco. She reports current alcohol use. She reports that she does not use drugs.  No Known Allergies  FamHx No family history on file.  Prior to Admission medications   Medication Sig Start Date End Date Taking? Authorizing Provider  HYDROcodone-acetaminophen (NORCO/VICODIN) 5-325 MG tablet Take 1-2 tablets by mouth every 6 (six) hours as needed for severe pain. 02/03/17   Vassie Loll, MD  hydrocortisone (ANUSOL-HC) 2.5 % rectal cream Place 1 application rectally 2 (two)  times daily. 08/09/20   Towanda Octave, MD  ibuprofen (ADVIL,MOTRIN) 200 MG tablet Take 2 tablets (400 mg total) by mouth every 8 (eight) hours as needed for fever, headache or moderate pain. 02/03/17   Vassie Loll, MD  omeprazole (PRILOSEC) 20 MG capsule Take by mouth. 06/10/20 12/07/20  [provider]  ondansetron (ZOFRAN ODT) 8 MG disintegrating tablet Take 1 tablet (8 mg total) by mouth every 8 (eight) hours as needed for nausea or vomiting. 02/03/17   Vassie Loll, MD  paliperidone (INVEGA) 9 MG 24 hr tablet Take by mouth.    [provider]  polyethylene glycol (MIRALAX) 17 g packet Take 17 g by mouth daily. 08/09/20   Towanda Octave, MD  sertraline (ZOLOFT) 100 MG tablet Take by mouth.    [provider]  triamcinolone acetonide (TRIESENCE) 40 MG/ML SUSP Inject into the articular space. 09/10/17   [provider]  zolpidem (AMBIEN) 10 MG tablet Take by mouth.    [provider]    Physical Exam: Vitals:   02/02/21 0200 02/02/21 0345 02/02/21 0500 02/02/21 0623  BP: 97/61 103/62 96/60 121/67  Pulse: 90 76 74 60  Resp: (!) 21 18 19 19   Temp:   98.1 F (36.7 C) 98.5 F (36.9 C)  TempSrc:   Oral Oral  SpO2: 92% 92% 96% 100%  Weight:      Height:        General: 55 y.o. female resting in bed in NAD Eyes: PERRL, normal sclera ENMT: Nares patent w/o discharge, orophaynx clear, dentition normal, ears w/o  discharge/lesions/ulcers Neck: Supple, trachea midline Cardiovascular: RRR, +S1, S2, no m/g/r, equal pulses throughout Respiratory: CTABL, no w/r/r, normal WOB GI: BS+, obese, soft, TTP RUQ/LUQ, no masses noted, no organomegaly noted MSK: No e/c/c Skin: No rashes, bruises, ulcerations noted Neuro: A&O x 3, no focal deficits Psyc: Appropriate interaction and affect, calm/cooperative  Labs on Admission: I have personally reviewed following labs and imaging studies  CBC: Recent Labs  Lab 02/02/21 0124  WBC 11.5*  NEUTROABS 9.4*  HGB  14.9  HCT 44.1  MCV 86.6  PLT 290   Basic Metabolic Panel: Recent Labs  Lab 02/02/21 0124  NA 136  K 4.2  CL 99  CO2 24  GLUCOSE 133*  BUN 18  CREATININE 1.07*  CALCIUM 9.5   GFR: Estimated Creatinine Clearance: 71.2 mL/min (A) (by C-G formula based on SCr of 1.07 mg/dL (H)). Liver Function Tests: Recent Labs  Lab 02/02/21 0124  AST 35  ALT 25  ALKPHOS 94  BILITOT 0.5  PROT 7.8  ALBUMIN 4.4   Recent Labs  Lab 02/02/21 0124  LIPASE 390*   No results for input(s): AMMONIA in the last 168 hours. Coagulation Profile: No results for input(s): INR, PROTIME in the last 168 hours. Cardiac Enzymes: No results for input(s): CKTOTAL, CKMB, CKMBINDEX, TROPONINI in the last 168 hours. BNP (last 3 results) No results for input(s): PROBNP in the last 8760 hours. HbA1C: No results for input(s): HGBA1C in the last 72 hours. CBG: No results for input(s): GLUCAP in the last 168 hours. Lipid Profile: No results for input(s): CHOL, HDL, LDLCALC, TRIG, CHOLHDL, LDLDIRECT in the last 72 hours. Thyroid Function Tests: No results for input(s): TSH, T4TOTAL, FREET4, T3FREE, THYROIDAB in the last 72 hours. Anemia Panel: No results for input(s): VITAMINB12, FOLATE, FERRITIN, TIBC, IRON, RETICCTPCT in the last 72 hours. Urine analysis:    Component Value Date/Time   COLORURINE YELLOW 02/02/2021 0124   APPEARANCEUR CLEAR 02/02/2021 0124   LABSPEC 1.020 02/02/2021 0124   PHURINE 7.0 02/02/2021 0124   GLUCOSEU NEGATIVE 02/02/2021 0124   HGBUR NEGATIVE 02/02/2021 0124   BILIRUBINUR NEGATIVE 02/02/2021 0124   KETONESUR NEGATIVE 02/02/2021 0124   PROTEINUR NEGATIVE 02/02/2021 0124   UROBILINOGEN 0.2 07/18/2015 0250   NITRITE NEGATIVE 02/02/2021 0124   LEUKOCYTESUR NEGATIVE 02/02/2021 0124    Radiological Exams on Admission: CT ABDOMEN PELVIS W CONTRAST  Result Date: 02/02/2021 CLINICAL DATA:  Pancreatitis suspected epigastric pain since 1700 hours yesterday after eating EXAM: CT  ABDOMEN AND PELVIS WITH CONTRAST TECHNIQUE: Multidetector CT imaging of the abdomen and pelvis was performed using the standard protocol following bolus administration of intravenous contrast. CONTRAST:  OMNIPAQUE IOHEXOL 300 MG/ML  SOLN COMPARISON:  CT 02/02/2017 FINDINGS: Lower chest: Bandlike and ground-glass opacities in the lung bases likely reflect atelectatic change. Lung bases otherwise clear. Trace pericardial fluid is similar to comparison. Normal cardiac size. Hepatobiliary: Diffuse hepatic hypoattenuation compatible with hepatic steatosis. No concerning focal liver lesion. Gallbladder appears surgically absent. No significant biliary ductal dilatation or visible intraductal gallstones Pancreas: Some mild stranding centered predominantly along the pancreatic head and uncinate may be related to the pancreas itself or secondary to a duodenal process, difficult to fully ascertain. Relative sparing of the more distal pancreatic body and tail. No pancreatic ductal dilatation. Uniform parenchymal enhancement of the pancreas. Some minimal fatty infiltration. Spleen: Normal in size. No concerning splenic lesions. Adrenals/Urinary Tract: Normal adrenals. Kidneys are normally located with symmetric enhancement and excretion. No suspicious renal lesion, urolithiasis or hydronephrosis.  Urinary bladder is largely decompressed at the time of exam and therefore poorly evaluated by CT imaging. No gross bladder abnormality accounting for underdistention. Stomach/Bowel: Small sliding-type hiatal hernia. Stomach is grossly unremarkable. There is some mild mural thickening and possible mucosal enhancement through the second and third portion of the duodenal sweep. Could reflect a primary duodenitis or reactive change adjacent to the pancreas. Remaining small bowel is free of significant thickening or dilatation. A normal appendix is visualized. No colonic dilatation or wall thickening. Vascular/Lymphatic: Some minimal  stranding around the SMA at the root, possibly related to the pancreatic versus duodenal process detailed above. No acute luminal abnormality or other significant vascular findings are present. No enlarged abdominal or pelvic lymph nodes. Reproductive: Uterus appears to be surgically absent. Stable likely benign calcification in the left ovary. No concerning adnexal masses or lesions. Other: Inflammatory changes and trace likely inflammatory free fluid in the retroperitoneum. No free air, organized collection or abscess is seen. No bowel containing hernias. Musculoskeletal: Multilevel degenerative changes are present in the imaged portions of the spine. 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 Mild degenerative changes in the hips and pelvis. No acute osseous abnormality or suspicious osseous lesion. IMPRESSION: 1. Some mild stranding centered predominantly along the pancreatic head and uncinate may be related to the pancreas itself or secondary to a duodenal process given some mild thickening of the 2nd to 3rd portion of the duodenal sweep, difficult to fully ascertain. Stranding and inflammation is seen extending into the retroperitoneum and about the SMA at the root. No free air, organized collection or abscess is seen. Recommend further evaluation with lipase with upper endoscopy as clinically warranted. 2. Hepatic steatosis. 3. Small sliding-type hiatal hernia. Electronically Signed   By: Kreg Shropshire M.D.   On: 02/02/2021 03:57   Assessment/Plan Acute pancreatitis     - admit to inpt, med-surg     - [place] ab shows CBD of 66mm which is just on the border of dilated; she's s/p cholecystectomy; she gives no indication of recent unexplained weight loss; her LFTs are normal; lipase is elevated at 390     - NPO except sips/ice chips     - IVF, pain control, anti-emetics     - spoke with [facility] (Dr. Matthias Hughs); check MRCP      Morbid obesity     - recommend diet, lifestyle changes; continue outpt follow  up  HLD     - not currently on medication for this     - check lipid panel  GERD     - protonix  Schizoaffective disorder PTSD     - continue home medications  Hx of HTN     - recent dx per her account, she was just prescribed medication, but has not picked it up     - BP looks good right now, follow  DVT prophylaxis: lovenox  Code Status: FULL  Family Communication: None at bedside  Consults called: [facility] (Dr. Matthias Hughs)   Status is: Inpatient  Remains inpatient appropriate because:Inpatient level of care appropriate due to severity of illness   Dispo: The patient is from: Home              Anticipated d/c is to: Home              Patient currently is not medically stable to d/c.   Difficult to place patient No  Teddy Spike DO [facility]  If 7PM-7AM, please contact night-coverage  [email or web address]  02/02/2021, 7:16 AM

## 2021-02-02 NOTE — ED Notes (Signed)
Used interpreter device to discuss plan, such as IV insertion and medications

## 2021-02-02 NOTE — Plan of Care (Signed)
  Problem: Education: Goal: Knowledge of General Education information will improve Description: Including pain rating scale, medication(s)/side effects and non-pharmacologic comfort measures 02/02/2021 0651 by Robyne Peers, RN Outcome: Progressing 02/02/2021 0651 by Robyne Peers, RN Outcome: Progressing   Problem: Health Behavior/Discharge Planning: Goal: Ability to manage health-related needs will improve 02/02/2021 0651 by Robyne Peers, RN Outcome: Progressing 02/02/2021 0651 by Robyne Peers, RN Outcome: Progressing   Problem: Clinical Measurements: Goal: Ability to maintain clinical measurements within normal limits will improve 02/02/2021 0651 by Robyne Peers, RN Outcome: Progressing 02/02/2021 0651 by Robyne Peers, RN Outcome: Progressing Goal: Will remain free from infection 02/02/2021 0651 by Robyne Peers, RN Outcome: Progressing 02/02/2021 0651 by Robyne Peers, RN Outcome: Progressing Goal: Diagnostic test results will improve 02/02/2021 0651 by Robyne Peers, RN Outcome: Progressing 02/02/2021 0651 by Robyne Peers, RN Outcome: Progressing Goal: Respiratory complications will improve 02/02/2021 0651 by Robyne Peers, RN Outcome: Progressing 02/02/2021 0651 by Robyne Peers, RN Outcome: Progressing Goal: Cardiovascular complication will be avoided 02/02/2021 0651 by Robyne Peers, RN Outcome: Progressing 02/02/2021 0651 by Robyne Peers, RN Outcome: Progressing   Problem: Activity: Goal: Risk for activity intolerance will decrease 02/02/2021 0651 by Robyne Peers, RN Outcome: Progressing 02/02/2021 0651 by Robyne Peers, RN Outcome: Progressing   Problem: Nutrition: Goal: Adequate nutrition will be maintained 02/02/2021 0651 by Robyne Peers, RN Outcome: Progressing 02/02/2021 0651 by Robyne Peers, RN Outcome: Progressing   Problem: Coping: Goal: Level of anxiety will decrease 02/02/2021 0651 by Robyne Peers, RN Outcome:  Progressing 02/02/2021 0651 by Robyne Peers, RN Outcome: Progressing   Problem: Elimination: Goal: Will not experience complications related to bowel motility 02/02/2021 0651 by Robyne Peers, RN Outcome: Progressing 02/02/2021 0651 by Robyne Peers, RN Outcome: Progressing Goal: Will not experience complications related to urinary retention 02/02/2021 0651 by Robyne Peers, RN Outcome: Progressing 02/02/2021 0651 by Robyne Peers, RN Outcome: Progressing   Problem: Pain Managment: Goal: General experience of comfort will improve 02/02/2021 0651 by Robyne Peers, RN Outcome: Progressing 02/02/2021 0651 by Robyne Peers, RN Outcome: Progressing   Problem: Safety: Goal: Ability to remain free from injury will improve 02/02/2021 0651 by Robyne Peers, RN Outcome: Progressing 02/02/2021 0651 by Robyne Peers, RN Outcome: Progressing   Problem: Skin Integrity: Goal: Risk for impaired skin integrity will decrease 02/02/2021 0651 by Robyne Peers, RN Outcome: Progressing 02/02/2021 0651 by Robyne Peers, RN Outcome: Progressing

## 2021-02-03 ENCOUNTER — Inpatient Hospital Stay (HOSPITAL_COMMUNITY): Payer: Medicare Other

## 2021-02-03 ENCOUNTER — Ambulatory Visit: Payer: Medicare Other | Admitting: Medical

## 2021-02-03 DIAGNOSIS — K859 Acute pancreatitis without necrosis or infection, unspecified: Secondary | ICD-10-CM | POA: Diagnosis not present

## 2021-02-03 LAB — CBC
HCT: 41.5 % (ref 36.0–46.0)
Hemoglobin: 13.2 g/dL (ref 12.0–15.0)
MCH: 29.2 pg (ref 26.0–34.0)
MCHC: 31.8 g/dL (ref 30.0–36.0)
MCV: 91.8 fL (ref 80.0–100.0)
Platelets: 215 10*3/uL (ref 150–400)
RBC: 4.52 MIL/uL (ref 3.87–5.11)
RDW: 13.2 % (ref 11.5–15.5)
WBC: 8.5 10*3/uL (ref 4.0–10.5)
nRBC: 0 % (ref 0.0–0.2)

## 2021-02-03 LAB — COMPREHENSIVE METABOLIC PANEL
ALT: 159 U/L — ABNORMAL HIGH (ref 0–44)
AST: 294 U/L — ABNORMAL HIGH (ref 15–41)
Albumin: 3.9 g/dL (ref 3.5–5.0)
Alkaline Phosphatase: 131 U/L — ABNORMAL HIGH (ref 38–126)
Anion gap: 10 (ref 5–15)
BUN: 13 mg/dL (ref 6–20)
CO2: 24 mmol/L (ref 22–32)
Calcium: 8.6 mg/dL — ABNORMAL LOW (ref 8.9–10.3)
Chloride: 99 mmol/L (ref 98–111)
Creatinine, Ser: 0.78 mg/dL (ref 0.44–1.00)
GFR, Estimated: >60 mL/min (ref >60)
Glucose, Bld: 128 mg/dL — ABNORMAL HIGH (ref 70–99)
Potassium: 4.1 mmol/L (ref 3.5–5.1)
Sodium: 133 mmol/L — ABNORMAL LOW (ref 135–145)
Total Bilirubin: 1.4 mg/dL — ABNORMAL HIGH (ref 0.3–1.2)
Total Protein: 7.1 g/dL (ref 6.5–8.1)

## 2021-02-03 LAB — LIPASE, BLOOD: Lipase: 107 U/L — ABNORMAL HIGH (ref 11–51)

## 2021-02-03 MED ORDER — GADOBUTROL 1 MMOL/ML IV SOLN
10.0000 mL | Freq: Once | INTRAVENOUS | Status: AC | PRN
  Administered 2021-02-03: 10 mL via INTRAVENOUS

## 2021-02-03 MED ORDER — PIPERACILLIN-TAZOBACTAM 3.375 G IVPB
3.3750 g | Freq: Three times a day (TID) | INTRAVENOUS | Status: DC
  Administered 2021-02-03 – 2021-02-04 (×5): 3.375 g via INTRAVENOUS
  Filled 2021-02-03 (×5): qty 50

## 2021-02-03 NOTE — Progress Notes (Signed)
Pharmacy Antibiotic Note  Virginia King is a 55 y.o. female admitted on 02/02/2021 with IAI.  Pharmacy has been consulted for Zosyn dosing.  Plan: Zosyn 3.375g IV q8h (4 hour infusion).  Monitor clinical course, renal function, cultures as available   Height: 5\' 3"  (160 cm) Weight: 108.9 kg (240 lb) IBW/kg (Calculated) : 52.4  Temp (24hrs), Avg:98.2 F (36.8 C), Min:97.7 F (36.5 C), Max:98.7 F (37.1 C)  Recent Labs  Lab 02/02/21 0124 02/02/21 0905 02/03/21 0426  WBC 11.5* 10.6* 8.5  CREATININE 1.07* 1.01* 0.78    Estimated Creatinine Clearance: 95.2 mL/min (by C-G formula based on SCr of 0.78 mg/dL).    No Known Allergies   Dosage will likely remain stable at above dosage and need for further dosage adjustment appears unlikely at present.    Will sign off at this time.  Please reconsult if a change in clinical status warrants re-evaluation of dosage.     Thank you for allowing pharmacy to be a part of this patient's care.  04/05/21, PharmD, BCPS 02/03/2021 7:48 AM

## 2021-02-03 NOTE — Progress Notes (Signed)
[facility]  PROGRESS NOTE  Virginia King IEP:[id number] DOB: 1966/01/17 DOA: 02/02/2021 PCP: [facility], [facility] Public   Brief HPI:   55 year old female with history of arthritis, schizoaffective disorder, PTSD, morbid obesity, hypertension, hyperlipidemia came with abdominal pain.  She is Spanish-speaking.  In the ED CT abdomen/pelvis showed some mild stranding centered predominantly along the pancreatic head and uncinate likely from pancreas itself or secondary to her duodenal process given some mild thickening of 2nd-3rd portion of the duodenal sweep, difficult to ascertain.  Stranding inflammation seen extending into the retroperitoneum and about the SMA at the root.  No free air or abscess noted.    Subjective   Patient seen and examined, still has mild pain.  Liver enzymes have gone up today AST 24, ALT 159, alk phos 131.  Lipase is down to 107.   Assessment/Plan:     1. Epigastric pain/pancreatitis-patient presented with epigastric pain, lipase was elevated up to 390, now back to 107.  LFTs were within normal limits initially, now LFTs are elevated.  She does have history of hepatic steatosis.  CT abdomen/pelvis shows unexplained findings with pancreatic inflammation and mild thickening of the duodenum, inflammation in the retroperitoneum and above the SMA root.  MRI abdomen has been ordered today will follow results.  Will likely need GI evaluation based on the MRI results. 2. Hyperlipidemia-lipid panel obtained shows normal results. 3. GERD-continue PPI. 4. Schizoaffective disorder/PTSD-continue home medications 5. History of hypertension-blood pressure is stable.  Patient was prescribed medication but she has not picked it up.     COVID-19 Labs  No results for input(s): DDIMER, FERRITIN, LDH, CRP in the last 72 hours.  Lab Results  Component Value Date   [place] NEGATIVE 02/02/2021     Scheduled medications:   . docusate sodium   100 mg Oral BID  . enoxaparin (LOVENOX) injection  40 mg Subcutaneous Q24H  . paliperidone  9 mg Oral Daily  . sertraline  100 mg Oral Daily         CBG: No results for input(s): GLUCAP in the last 168 hours.  SpO2: (!) 89 % O2 Flow Rate (L/min): 2 L/min    CBC: Recent Labs  Lab 02/02/21 0124 02/02/21 0905 02/03/21 0426  WBC 11.5* 10.6* 8.5  NEUTROABS 9.4*  --   --   HGB 14.9 14.1 13.2  HCT 44.1 43.0 41.5  MCV 86.6 88.8 91.8  PLT 290 278 660    Basic Metabolic Panel: Recent Labs  Lab 02/02/21 0124 02/02/21 0905 02/03/21 0426  NA 136  --  133*  K 4.2  --  4.1  CL 99  --  99  CO2 24  --  24  GLUCOSE 133*  --  128*  BUN 18  --  13  CREATININE 1.07* 1.01* 0.78  CALCIUM 9.5  --  8.6*     Liver Function Tests: Recent Labs  Lab 02/02/21 0124 02/03/21 0426  AST 35 294*  ALT 25 159*  ALKPHOS 94 131*  BILITOT 0.5 1.4*  PROT 7.8 7.1  ALBUMIN 4.4 3.9     Antibiotics: Anti-infectives (From admission, onward)   Start     Dose/Rate Route Frequency Ordered Stop   02/03/21 0800  piperacillin-tazobactam (ZOSYN) IVPB 3.375 g        3.375 g 12.5 mL/hr over 240 Minutes Intravenous Every 8 hours 02/03/21 0747         DVT prophylaxis: Lovenox  Code Status: Full code  Family Communication:  No family at bedside   Consultants:    Procedures:      Objective   Vitals:   02/02/21 1829 02/02/21 2036 02/03/21 0419 02/03/21 1320  BP: 110/60 (!) 117/55 132/65 132/73  Pulse: 68 67 (!) 101 78  Resp: 19 18 19 16   Temp: 97.7 F (36.5 C) 98.4 F (36.9 C) 98.7 F (37.1 C) 98.2 F (36.8 C)  TempSrc: Oral Oral Oral   SpO2: 100% 96% (!) 85% (!) 89%  Weight:      Height:        Intake/Output Summary (Last 24 hours) at 02/03/2021 1418 Last data filed at 02/03/2021 0331 Gross per 24 hour  Intake 851.67 ml  Output --  Net 851.67 ml    03/05 1901 - 03/07 0700 In: 1851.7 [I.V.:851.7] Out: -   Filed Weights   02/02/21 0051  Weight: 108.9 kg     Physical Examination:    General-appears in no acute distress  Heart-S1-S2, regular, no murmur auscultated  Lungs-clear to auscultation bilaterally, no wheezing or crackles auscultated  Abdomen-soft, mild tenderness in epigastric region  Extremities-no edema in the lower extremities  Neuro-alert, oriented x3, no focal deficit noted   Status is: Inpatient  Dispo: The patient is from: Home              Anticipated d/c is to: Home              Anticipated d/c date is: 02/05/2021              Patient currently not medically stable for discharge  Barrier to discharge-epigastric pain, MRI abdomen pending            Data Reviewed:   Recent Results (from the past 240 hour(s))  Resp Panel by RT-PCR (Flu A&B, Covid) Nasopharyngeal Swab     Status: None   Collection Time: 02/02/21  4:16 AM   Specimen: Nasopharyngeal Swab; Nasopharyngeal(NP) swabs in vial transport medium  Result Value Ref Range Status   SARS Coronavirus 2 by RT PCR NEGATIVE NEGATIVE Final    Comment: (NOTE) SARS-CoV-2 target nucleic acids are NOT DETECTED.  The SARS-CoV-2 RNA is generally detectable in upper respiratory specimens during the acute phase of infection. The lowest concentration of SARS-CoV-2 viral copies this assay can detect is 138 copies/mL. A negative result does not preclude SARS-Cov-2 infection and should not be used as the sole basis for treatment or other patient management decisions. A negative result may occur with  improper specimen collection/handling, submission of specimen other than nasopharyngeal swab, presence of viral mutation(s) within the areas targeted by this assay, and inadequate number of viral copies(<138 copies/mL). A negative result must be combined with clinical observations, patient history, and epidemiological information. The expected result is Negative.  Fact Sheet for Patients:  EntrepreneurPulse.com.au  Fact Sheet for Healthcare  Providers:  IncredibleEmployment.be  This test is no t yet approved or cleared by the [place] FDA and  has been authorized for detection and/or diagnosis of SARS-CoV-2 by FDA under an Emergency Use Authorization (EUA). This EUA will remain  in effect (meaning this test can be used) for the duration of the COVID-19 declaration under Section 564(b)(1) of the Act, 21 U.S.C.section 360bbb-3(b)(1), unless the authorization is terminated  or revoked sooner.       Influenza A by PCR NEGATIVE NEGATIVE Final   Influenza B by PCR NEGATIVE NEGATIVE Final    Comment: (NOTE) The Xpert Xpress SARS-CoV-2/FLU/RSV plus assay is intended as an aid  in the diagnosis of influenza from Nasopharyngeal swab specimens and should not be used as a sole basis for treatment. Nasal washings and aspirates are unacceptable for Xpert Xpress SARS-CoV-2/FLU/RSV testing.  Fact Sheet for Patients: EntrepreneurPulse.com.au  Fact Sheet for Healthcare Providers: IncredibleEmployment.be  This test is not yet approved or cleared by the [place] FDA and has been authorized for detection and/or diagnosis of SARS-CoV-2 by FDA under an Emergency Use Authorization (EUA). This EUA will remain in effect (meaning this test can be used) for the duration of the COVID-19 declaration under Section 564(b)(1) of the Act, 21 U.S.C. section 360bbb-3(b)(1), unless the authorization is terminated or revoked.  Performed at [facility], [address] [place], [place] [address]     Recent Labs  Lab 02/02/21 0124 02/03/21 0421  LIPASE 390* 107*   No results for input(s): AMMONIA in the last 168 hours.  Cardiac Enzymes: No results for input(s): CKTOTAL, CKMB, CKMBINDEX, TROPONINI in the last 168 hours. BNP (last 3 results) No results for input(s): BNP in the last 8760 hours.  ProBNP (last 3 results) No results for input(s): PROBNP in the last 8760  hours.  Studies:  CT ABDOMEN PELVIS W CONTRAST  Result Date: 02/02/2021 CLINICAL DATA:  Pancreatitis suspected epigastric pain since 1700 hours yesterday after eating EXAM: CT ABDOMEN AND PELVIS WITH CONTRAST TECHNIQUE: Multidetector CT imaging of the abdomen and pelvis was performed using the standard protocol following bolus administration of intravenous contrast. CONTRAST:  160m OMNIPAQUE IOHEXOL 300 MG/ML  SOLN COMPARISON:  CT 02/02/2017 FINDINGS: Lower chest: Bandlike and ground-glass opacities in the lung bases likely reflect atelectatic change. Lung bases otherwise clear. Trace pericardial fluid is similar to comparison. Normal cardiac size. Hepatobiliary: Diffuse hepatic hypoattenuation compatible with hepatic steatosis. No concerning focal liver lesion. Gallbladder appears surgically absent. No significant biliary ductal dilatation or visible intraductal gallstones Pancreas: Some mild stranding centered predominantly along the pancreatic head and uncinate may be related to the pancreas itself or secondary to a duodenal process, difficult to fully ascertain. Relative sparing of the more distal pancreatic body and tail. No pancreatic ductal dilatation. Uniform parenchymal enhancement of the pancreas. Some minimal fatty infiltration. Spleen: Normal in size. No concerning splenic lesions. Adrenals/Urinary Tract: Normal adrenals. Kidneys are normally located with symmetric enhancement and excretion. No suspicious renal lesion, urolithiasis or hydronephrosis. Urinary bladder is largely decompressed at the time of exam and therefore poorly evaluated by CT imaging. No gross bladder abnormality accounting for underdistention. Stomach/Bowel: Small sliding-type hiatal hernia. Stomach is grossly unremarkable. There is some mild mural thickening and possible mucosal enhancement through the second and third portion of the duodenal sweep. Could reflect a primary duodenitis or reactive change adjacent to the  pancreas. Remaining small bowel is free of significant thickening or dilatation. A normal appendix is visualized. No colonic dilatation or wall thickening. Vascular/Lymphatic: Some minimal stranding around the SMA at the root, possibly related to the pancreatic versus duodenal process detailed above. No acute luminal abnormality or other significant vascular findings are present. No enlarged abdominal or pelvic lymph nodes. Reproductive: Uterus appears to be surgically absent. Stable likely benign calcification in the left ovary. No concerning adnexal masses or lesions. Other: Inflammatory changes and trace likely inflammatory free fluid in the retroperitoneum. No free air, organized collection or abscess is seen. No bowel containing hernias. Musculoskeletal: Multilevel degenerative changes are present in the imaged portions of the spine. 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 Mild degenerative changes in the hips  and pelvis. No acute osseous abnormality or suspicious osseous lesion. IMPRESSION: 1. Some mild stranding centered predominantly along the pancreatic head and uncinate may be related to the pancreas itself or secondary to a duodenal process given some mild thickening of the 2nd to 3rd portion of the duodenal sweep, difficult to fully ascertain. Stranding and inflammation is seen extending into the retroperitoneum and about the SMA at the root. No free air, organized collection or abscess is seen. Recommend further evaluation with lipase with upper endoscopy as clinically warranted. 2. Hepatic steatosis. 3. Small sliding-type hiatal hernia. Electronically Signed   By: Lovena Le M.D.   On: 02/02/2021 03:57       [place]   [facility] If 7PM-7AM, please contact night-coverage at [email or web address], Office  [phone]   02/03/2021, 2:18 PM  LOS: 1 day

## 2021-02-04 DIAGNOSIS — K859 Acute pancreatitis without necrosis or infection, unspecified: Secondary | ICD-10-CM | POA: Diagnosis not present

## 2021-02-04 LAB — CBC
HCT: 38 % (ref 36.0–46.0)
Hemoglobin: 12.3 g/dL (ref 12.0–15.0)
MCH: 29.1 pg (ref 26.0–34.0)
MCHC: 32.4 g/dL (ref 30.0–36.0)
MCV: 89.8 fL (ref 80.0–100.0)
Platelets: 192 10*3/uL (ref 150–400)
RBC: 4.23 MIL/uL (ref 3.87–5.11)
RDW: 12.8 % (ref 11.5–15.5)
WBC: 7.3 10*3/uL (ref 4.0–10.5)
nRBC: 0 % (ref 0.0–0.2)

## 2021-02-04 LAB — COMPREHENSIVE METABOLIC PANEL
ALT: 96 U/L — ABNORMAL HIGH (ref 0–44)
AST: 90 U/L — ABNORMAL HIGH (ref 15–41)
Albumin: 3.6 g/dL (ref 3.5–5.0)
Alkaline Phosphatase: 113 U/L (ref 38–126)
Anion gap: 9 (ref 5–15)
BUN: 11 mg/dL (ref 6–20)
CO2: 25 mmol/L (ref 22–32)
Calcium: 8.6 mg/dL — ABNORMAL LOW (ref 8.9–10.3)
Chloride: 103 mmol/L (ref 98–111)
Creatinine, Ser: 0.77 mg/dL (ref 0.44–1.00)
GFR, Estimated: >60 mL/min (ref >60)
Glucose, Bld: 110 mg/dL — ABNORMAL HIGH (ref 70–99)
Potassium: 3.9 mmol/L (ref 3.5–5.1)
Sodium: 137 mmol/L (ref 135–145)
Total Bilirubin: 1 mg/dL (ref 0.3–1.2)
Total Protein: 6.7 g/dL (ref 6.5–8.1)

## 2021-02-04 NOTE — Progress Notes (Addendum)
[facility]  PROGRESS NOTE  Virginia CobbsXiomara Sanchez King ZOX:[id number] DOB: 04/04/1966 DOA: 02/02/2021 [facility]   Brief HPI:   55 year old female with history of arthritis, schizoaffective disorder, PTSD, morbid obesity, hypertension, hyperlipidemia came with abdominal pain.  She is Spanish-speaking.  In the ED CT abdomen/pelvis showed some mild stranding centered predominantly along the pancreatic head and uncinate likely from pancreas itself or secondary to her duodenal process given some mild thickening of 2nd-3rd portion of the duodenal sweep, difficult to ascertain.  Stranding inflammation seen extending into the retroperitoneum and about the SMA at the root.  No free air or abscess noted.    Subjective   Patient seen and examined, epigastric pain has improved.  MRCP abdomen shows mild pancreatitis.  No substantial duodenal wall thickening.  No evidence of pancreatic necrosis.   Assessment/Plan:     1. Epigastric pain/pancreatitis-patient presented with epigastric pain, lipase was elevated up to 390, now back to 107.  Initially LFTs were within normal limits, then AST and ALT went up to 294/159 respectively.  This morning LFTs were within normal limits initially, now LFTs are elevated.  She does have history of hepatic steatosis.  CT abdomen/pelvis shows unexplained findings with pancreatic inflammation and mild thickening of the duodenum, inflammation in the retroperitoneum and above the SMA root.  MRI abdomen shows mild edema in and around the head of pancreas/uncinate process tracking in the anterior pararenal space around third portion of the duodenum.  No dilatation of the main pancreatic duct.  Called and discussed with Dr. Matthias HughsBuccini, he recommends that patient can be discharged home and follow-up with GI as outpatient.  Patient might need endoscopic ultrasound.  We will stop IV Zosyn at this time.  Will advance diet to full liquid.  If tolerates  diet she can be discharged on low-fat diet in a.m. 2. Hyperlipidemia-lipid panel obtained shows normal results. 3. GERD-continue PPI. 4. Schizoaffective disorder/PTSD-continue home medications 5. History of hypertension-blood pressure is stable.  Patient was prescribed medication but she has not picked it up.     COVID-19 Labs  No results for input(s): DDIMER, FERRITIN, LDH, CRP in the last 72 hours.  Lab Results  Component Value Date   SARSCOV2NAA NEGATIVE 02/02/2021     Scheduled medications:   . docusate sodium  100 mg Oral BID  . enoxaparin (LOVENOX) injection  40 mg Subcutaneous Q24H  . paliperidone  9 mg Oral Daily  . sertraline  100 mg Oral Daily         CBG: No results for input(s): GLUCAP in the last 168 hours.  SpO2: 94 % O2 Flow Rate (L/min): 2 L/min    CBC: Recent Labs  Lab 02/02/21 0124 02/02/21 0905 02/03/21 0426 02/04/21 0356  WBC 11.5* 10.6* 8.5 7.3  NEUTROABS 9.4*  --   --   --   HGB 14.9 14.1 13.2 12.3  HCT 44.1 43.0 41.5 38.0  MCV 86.6 88.8 91.8 89.8  PLT 290 278 215 192    Basic Metabolic Panel: Recent Labs  Lab 02/02/21 0124 02/02/21 0905 02/03/21 0426 02/04/21 0356  NA 136  --  133* 137  K 4.2  --  4.1 3.9  CL 99  --  99 103  CO2 24  --  24 25  GLUCOSE 133*  --  128* 110*  BUN 18  --  13 11  CREATININE 1.07* 1.01* 0.78 0.77  CALCIUM 9.5  --  8.6* 8.6*     Liver  Function Tests: Recent Labs  Lab 02/02/21 0124 02/03/21 0426 02/04/21 0356  AST 35 294* 90*  ALT 25 159* 96*  ALKPHOS 94 131* 113  BILITOT 0.5 1.4* 1.0  PROT 7.8 7.1 6.7  ALBUMIN 4.4 3.9 3.6     Antibiotics: Anti-infectives (From admission, onward)   Start     Dose/Rate Route Frequency Ordered Stop   02/03/21 0800  piperacillin-tazobactam (ZOSYN) IVPB 3.375 g  Status:  Discontinued        3.375 g 12.5 mL/hr over 240 Minutes Intravenous Every 8 hours 02/03/21 0747 02/04/21 1612       DVT prophylaxis: Lovenox  Code Status: Full code  Family  Communication: No family at bedside      Objective   Vitals:   02/03/21 1320 02/03/21 2148 02/04/21 0447 02/04/21 1350  BP: 132/73 127/74 122/68 (!) 145/91  Pulse: 78 76 77 79  Resp: 16 19 19 16   Temp: 98.2 F (36.8 C) 98.8 F (37.1 C) 98.7 F (37.1 C) 98.5 F (36.9 C)  TempSrc:  Oral Oral Oral  SpO2: (!) 89% 92% 92% 94%  Weight:      Height:        Intake/Output Summary (Last 24 hours) at 02/04/2021 1612 Last data filed at 02/04/2021 0600 Gross per 24 hour  Intake 480 ml  Output --  Net 480 ml    03/06 1901 - 03/08 0700 In: 2058 [P.O.:480; I.V.:1531.5] Out: -   Filed Weights   02/02/21 0051  Weight: 108.9 kg    Physical Examination:   General-appears in no acute distress  Heart-S1-S2, regular, no murmur auscultated  Lungs-clear to auscultation bilaterally, no wheezing or crackles auscultated  Abdomen-soft,  mild tenderness in right upper quadrant  Extremities-no edema in the lower extremities  Neuro-alert, oriented x3, no focal deficit noted   Status is: Inpatient  Dispo: The patient is from: Home              Anticipated d/c is to: Home              Anticipated d/c date is: 02/05/2021              Patient currently not medically stable for discharge  Barrier to discharge-epigastric pain, MRI abdomen pending            Data Reviewed:   Recent Results (from the past 240 hour(s))  Resp Panel by RT-PCR (Flu A&B, Covid) Nasopharyngeal Swab     Status: None   Collection Time: 02/02/21  4:16 AM   Specimen: Nasopharyngeal Swab; Nasopharyngeal(NP) swabs in vial transport medium  Result Value Ref Range Status   SARS Coronavirus 2 by RT PCR NEGATIVE NEGATIVE Final    Comment: (NOTE) SARS-CoV-2 target nucleic acids are NOT DETECTED.  The SARS-CoV-2 RNA is generally detectable in upper respiratory specimens during the acute phase of infection. The lowest concentration of SARS-CoV-2 viral copies this assay can detect is 138 copies/mL. A negative  result does not preclude SARS-Cov-2 infection and should not be used as the sole basis for treatment or other patient management decisions. A negative result may occur with  improper specimen collection/handling, submission of specimen other than nasopharyngeal swab, presence of viral mutation(s) within the areas targeted by this assay, and inadequate number of viral copies(<138 copies/mL). A negative result must be combined with clinical observations, patient history, and epidemiological information. The expected result is Negative.  Fact Sheet for Patients:  04/04/21  Fact Sheet for Healthcare Providers:  BloggerCourse.com  This test is no t yet approved or cleared by the [place] and  has been authorized for detection and/or diagnosis of SARS-CoV-2 by FDA under an Emergency Use Authorization (EUA). This EUA will remain  in effect (meaning this test can be used) for the duration of the COVID-19 declaration under Section 564(b)(1) of the Act, 21 U.S.C.section 360bbb-3(b)(1), unless the authorization is terminated  or revoked sooner.       Influenza A by PCR NEGATIVE NEGATIVE Final   Influenza B by PCR NEGATIVE NEGATIVE Final    Comment: (NOTE) The Xpert Xpress SARS-CoV-2/FLU/RSV plus assay is intended as an aid in the diagnosis of influenza from Nasopharyngeal swab specimens and should not be used as a sole basis for treatment. Nasal washings and aspirates are unacceptable for Xpert Xpress SARS-CoV-2/FLU/RSV testing.  Fact Sheet for Patients: BloggerCourse.com  Fact Sheet for Healthcare Providers: SeriousBroker.it  This test is not yet approved or cleared by the [place] FDA and has been authorized for detection and/or diagnosis of SARS-CoV-2 by FDA under an Emergency Use Authorization (EUA). This EUA will remain in effect (meaning this test can be used)  for the duration of the COVID-19 declaration under Section 564(b)(1) of the Act, 21 U.S.C. section 360bbb-3(b)(1), unless the authorization is terminated or revoked.  Performed at [facility], [address] [place], [place] [address]     Recent Labs  Lab 02/02/21 0124 02/03/21 0421  LIPASE 390* 107*   No results for input(s): AMMONIA in the last 168 hours.  Cardiac Enzymes: No results for input(s): CKTOTAL, CKMB, CKMBINDEX, TROPONINI in the last 168 hours. BNP (last 3 results) No results for input(s): BNP in the last 8760 hours.  ProBNP (last 3 results) No results for input(s): PROBNP in the last 8760 hours.  Studies:  MR 3D Recon At Scanner  Result Date: 02/03/2021 CLINICAL DATA:  Pancreatitis. EXAM: MRI ABDOMEN WITHOUT AND WITH CONTRAST (INCLUDING MRCP) TECHNIQUE: Multiplanar multisequence MR imaging of the abdomen was performed both before and after the administration of intravenous contrast. Heavily T2-weighted images of the biliary and pancreatic ducts were obtained, and three-dimensional MRCP images were rendered by post processing. CONTRAST:  86mL GADAVIST GADOBUTROL 1 MMOL/ML IV SOLN COMPARISON:  CT scan 02/02/2021. FINDINGS: Lower chest: Left lower lobe atelectasis noted with tiny bilateral effusions. Hepatobiliary: Liver parenchyma is heterogeneous with heterogeneous loss of signal intensity on out of phase T1 imaging, consistent with steatosis. Postcontrast imaging shows no arterial phase hyperenhancement or suspicious enhancing mass lesion. Nonvisualization of the gallbladder may be due to decompression or surgical resection. No intra or extrahepatic biliary duct dilatation. No choledocholithiasis on MRCP imaging. Pancreas: There is some edema in the around the head of the pancreas and uncinate process tracking in the anterior pararenal space along the third portion of the duodenum. No substantial duodenal wall thickening by MRI today. Postcontrast imaging shows  no discrete or suspicious mass lesion within the pancreas. No dilatation of the main pancreatic duct. Spleen:  No splenomegaly. No focal mass lesion. Adrenals/Urinary Tract:  No adrenal nodule or mass. Stomach/Bowel: Stomach is unremarkable. No gastric wall thickening. No evidence of outlet obstruction. Duodenum is normally positioned as is the ligament of Treitz. No small bowel or colonic dilatation within the visualized abdomen. Vascular/Lymphatic: No abdominal aortic aneurysm. Portal vein, superior mesenteric vein, and splenic vein are patent. Other:  No intraperitoneal free fluid. Musculoskeletal: No focal suspicious marrow enhancement within the visualized bony anatomy. IMPRESSION: 1. Mild edema in and  around the head of the pancreas / uncinate process tracking in the anterior pararenal space along the third portion of the duodenum. No substantial duodenal wall thickening by MRI today. No evidence for pancreatic necrosis. No dilatation of the main pancreatic duct. 2. No biliary dilatation or evidence of choledocholithiasis. 3. Hepatic steatosis. 4. Left lower lobe atelectasis with tiny bilateral effusions. Electronically Signed   By: Kennith Center M.D.   On: 02/03/2021 20:37   MR ABDOMEN MRCP W WO CONTAST  Result Date: 02/03/2021 CLINICAL DATA:  Pancreatitis. EXAM: MRI ABDOMEN WITHOUT AND WITH CONTRAST (INCLUDING MRCP) TECHNIQUE: Multiplanar multisequence MR imaging of the abdomen was performed both before and after the administration of intravenous contrast. Heavily T2-weighted images of the biliary and pancreatic ducts were obtained, and three-dimensional MRCP images were rendered by post processing. CONTRAST:  24mL GADAVIST GADOBUTROL 1 MMOL/ML IV SOLN COMPARISON:  CT scan 02/02/2021. FINDINGS: Lower chest: Left lower lobe atelectasis noted with tiny bilateral effusions. Hepatobiliary: Liver parenchyma is heterogeneous with heterogeneous loss of signal intensity on out of phase T1 imaging, consistent with  steatosis. Postcontrast imaging shows no arterial phase hyperenhancement or suspicious enhancing mass lesion. Nonvisualization of the gallbladder may be due to decompression or surgical resection. No intra or extrahepatic biliary duct dilatation. No choledocholithiasis on MRCP imaging. Pancreas: There is some edema in the around the head of the pancreas and uncinate process tracking in the anterior pararenal space along the third portion of the duodenum. No substantial duodenal wall thickening by MRI today. Postcontrast imaging shows no discrete or suspicious mass lesion within the pancreas. No dilatation of the main pancreatic duct. Spleen:  No splenomegaly. No focal mass lesion. Adrenals/Urinary Tract:  No adrenal nodule or mass. Stomach/Bowel: Stomach is unremarkable. No gastric wall thickening. No evidence of outlet obstruction. Duodenum is normally positioned as is the ligament of Treitz. No small bowel or colonic dilatation within the visualized abdomen. Vascular/Lymphatic: No abdominal aortic aneurysm. Portal vein, superior mesenteric vein, and splenic vein are patent. Other:  No intraperitoneal free fluid. Musculoskeletal: No focal suspicious marrow enhancement within the visualized bony anatomy. IMPRESSION: 1. Mild edema in and around the head of the pancreas / uncinate process tracking in the anterior pararenal space along the third portion of the duodenum. No substantial duodenal wall thickening by MRI today. No evidence for pancreatic necrosis. No dilatation of the main pancreatic duct. 2. No biliary dilatation or evidence of choledocholithiasis. 3. Hepatic steatosis. 4. Left lower lobe atelectasis with tiny bilateral effusions. Electronically Signed   By: Kennith Center M.D.   On: 02/03/2021 20:37       Talya Quain S Aspasia Rude   [facility] If 7PM-7AM, please contact night-coverage at [email or web address], Office  [phone]   02/04/2021, 4:12 PM  LOS: 2 days

## 2021-02-05 DIAGNOSIS — K859 Acute pancreatitis without necrosis or infection, unspecified: Secondary | ICD-10-CM | POA: Diagnosis not present

## 2021-02-05 LAB — COMPREHENSIVE METABOLIC PANEL
ALT: 62 U/L — ABNORMAL HIGH (ref 0–44)
AST: 40 U/L (ref 15–41)
Albumin: 3.6 g/dL (ref 3.5–5.0)
Alkaline Phosphatase: 105 U/L (ref 38–126)
Anion gap: 6 (ref 5–15)
BUN: 9 mg/dL (ref 6–20)
CO2: 28 mmol/L (ref 22–32)
Calcium: 8.7 mg/dL — ABNORMAL LOW (ref 8.9–10.3)
Chloride: 107 mmol/L (ref 98–111)
Creatinine, Ser: 0.78 mg/dL (ref 0.44–1.00)
GFR, Estimated: >60 mL/min (ref >60)
Glucose, Bld: 101 mg/dL — ABNORMAL HIGH (ref 70–99)
Potassium: 3.8 mmol/L (ref 3.5–5.1)
Sodium: 141 mmol/L (ref 135–145)
Total Bilirubin: 0.8 mg/dL (ref 0.3–1.2)
Total Protein: 6.8 g/dL (ref 6.5–8.1)

## 2021-02-05 NOTE — Plan of Care (Signed)

## 2021-02-05 NOTE — Discharge Summary (Signed)
Physician Discharge Summary  Rhylin Venters SJG:[id number] DOB: 1966-03-16 DOA: 02/02/2021  PCP: [facility]  Admit date: 02/02/2021 Discharge date: 02/05/2021  Admitted From: Home Disposition: Home  Recommendations for Outpatient Follow-up:  1. Follow up with PCP in 1-2 weeks 2. Please obtain BMP/CBC in one week  Home Health: None Equipment/Devices: None  Discharge Condition: Stable CODE STATUS: Full Diet recommendation: Full liquid diet, advance slowly as tolerated, avoid fatty greasy meals.  Brief/Interim Summary: 55 year old female with history of arthritis, schizoaffective disorder, PTSD, morbid obesity, hypertension, hyperlipidemia came with abdominal pain.  She is Spanish-speaking.  In the ED CT abdomen/pelvis showed some mild stranding centered predominantly along the pancreatic head and uncinate likely from pancreas itself or secondary to her duodenal process given some mild thickening of 2nd-3rd portion of the duodenal sweep, difficult to ascertain.  Stranding inflammation seen extending into the retroperitoneum and about the SMA at the root.  No free air or abscess noted.  Patient admitted as above with acute pancreatitis with unclear etiology.  GI was consulted for further evaluation and treatment and indicates that given patient's improvement in tolerance of p.o. she would likely continue to improve over the next few days without further need for intervention or imaging.  Given patient's ongoing improvement tolerating p.o. without any further difficulty and pain is currently well controlled with downtrending labs patient was otherwise agreeable and stable for discharge home.  Unclear if patient had cholelithiasis and obstruction or possible alcohol induced pancreatitis.  Patient's history remains somewhat vague and patient denies any notable alcohol history.  At this time given resolution of symptoms patient is otherwise stable and agreeable for  discharge home, follow-up with PCP as scheduled.  Patient did request follow-up with GI, we indicated that she should call their office for further evaluation and treatment given her resolution of symptoms she did not need urgent follow-up at their office from our standpoint.  Discharge Diagnoses:  Active Problems:   Pancreatitis    Discharge Instructions  Discharge Instructions    Call MD for:  persistant nausea and vomiting   Complete by: As directed    Diet - low sodium heart healthy   Complete by: As directed    Increase activity slowly   Complete by: As directed      Allergies as of 02/05/2021   No Known Allergies     Medication List    STOP taking these medications   ibuprofen 200 MG tablet Commonly known as: ADVIL     TAKE these medications   diclofenac 75 MG EC tablet Commonly known as: VOLTAREN Take 75 mg by mouth daily as needed for pain.   HYDROcodone-acetaminophen 5-325 MG tablet Commonly known as: NORCO/VICODIN Take 1-2 tablets by mouth every 6 (six) hours as needed for severe pain.   hydrocortisone 2.5 % rectal cream Commonly known as: Anusol-HC Place 1 application rectally 2 (two) times daily.   lisinopril-hydrochlorothiazide 20-25 MG tablet Commonly known as: ZESTORETIC Take 1 tablet by mouth daily.   omega-3 fish oil 1000 MG Caps capsule Commonly known as: MAXEPA Take 1 capsule by mouth daily.   omeprazole 20 MG capsule Commonly known as: PRILOSEC Take 20 mg by mouth daily.   ondansetron 8 MG disintegrating tablet Commonly known as: Zofran ODT Take 1 tablet (8 mg total) by mouth every 8 (eight) hours as needed for nausea or vomiting.   paliperidone 9 MG 24 hr tablet Commonly known as: INVEGA Take 9 mg by mouth daily.  polyethylene glycol 17 g packet Commonly known as: MiraLax Take 17 g by mouth daily.   sertraline 100 MG tablet Commonly known as: ZOLOFT Take 100 mg by mouth daily.   zinc sulfate 220 (50 Zn) MG capsule Take 220 mg  by mouth daily.   zolpidem 10 MG tablet Commonly known as: AMBIEN Take 10 mg by mouth at bedtime as needed for sleep.       Follow-up Information    Willis Modena, MD. Schedule an appointment as soon as possible for a visit in 2 week(s).   Specialty: Gastroenterology Contact information: [address] [place] [address] [phone]              No Known Allergies  Consultations: GI  Procedures/Studies: CT ABDOMEN PELVIS W CONTRAST  Result Date: 02/02/2021 CLINICAL DATA:  Pancreatitis suspected epigastric pain since 1700 hours yesterday after eating EXAM: CT ABDOMEN AND PELVIS WITH CONTRAST TECHNIQUE: Multidetector CT imaging of the abdomen and pelvis was performed using the standard protocol following bolus administration of intravenous contrast. CONTRAST:  OMNIPAQUE IOHEXOL 300 MG/ML  SOLN COMPARISON:  CT 02/02/2017 FINDINGS: Lower chest: Bandlike and ground-glass opacities in the lung bases likely reflect atelectatic change. Lung bases otherwise clear. Trace pericardial fluid is similar to comparison. Normal cardiac size. Hepatobiliary: Diffuse hepatic hypoattenuation compatible with hepatic steatosis. No concerning focal liver lesion. Gallbladder appears surgically absent. No significant biliary ductal dilatation or visible intraductal gallstones Pancreas: Some mild stranding centered predominantly along the pancreatic head and uncinate may be related to the pancreas itself or secondary to a duodenal process, difficult to fully ascertain. Relative sparing of the more distal pancreatic body and tail. No pancreatic ductal dilatation. Uniform parenchymal enhancement of the pancreas. Some minimal fatty infiltration. Spleen: Normal in size. No concerning splenic lesions. Adrenals/Urinary Tract: Normal adrenals. Kidneys are normally located with symmetric enhancement and excretion. No suspicious renal lesion, urolithiasis or hydronephrosis. Urinary bladder is  largely decompressed at the time of exam and therefore poorly evaluated by CT imaging. No gross bladder abnormality accounting for underdistention. Stomach/Bowel: Small sliding-type hiatal hernia. Stomach is grossly unremarkable. There is some mild mural thickening and possible mucosal enhancement through the second and third portion of the duodenal sweep. Could reflect a primary duodenitis or reactive change adjacent to the pancreas. Remaining small bowel is free of significant thickening or dilatation. A normal appendix is visualized. No colonic dilatation or wall thickening. Vascular/Lymphatic: Some minimal stranding around the SMA at the root, possibly related to the pancreatic versus duodenal process detailed above. No acute luminal abnormality or other significant vascular findings are present. No enlarged abdominal or pelvic lymph nodes. Reproductive: Uterus appears to be surgically absent. Stable likely benign calcification in the left ovary. No concerning adnexal masses or lesions. Other: Inflammatory changes and trace likely inflammatory free fluid in the retroperitoneum. No free air, organized collection or abscess is seen. No bowel containing hernias. Musculoskeletal: Multilevel degenerative changes are present in the imaged portions of the spine. 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 Mild degenerative changes in the hips and pelvis. No acute osseous abnormality or suspicious osseous lesion. IMPRESSION: 1. Some mild stranding centered predominantly along the pancreatic head and uncinate may be related to the pancreas itself or secondary to a duodenal process given some mild thickening of the 2nd to 3rd portion of the duodenal sweep, difficult to fully ascertain. Stranding and inflammation is seen extending into the retroperitoneum and about the SMA at the root. No free  air, organized collection or abscess is seen. Recommend further evaluation with lipase with upper endoscopy as clinically warranted.  2. Hepatic steatosis. 3. Small sliding-type hiatal hernia. Electronically Signed   By: Kreg Shropshire M.D.   On: 02/02/2021 03:57   MR 3D Recon At Scanner  Result Date: 02/03/2021 CLINICAL DATA:  Pancreatitis. EXAM: MRI ABDOMEN WITHOUT AND WITH CONTRAST (INCLUDING MRCP) TECHNIQUE: Multiplanar multisequence MR imaging of the abdomen was performed both before and after the administration of intravenous contrast. Heavily T2-weighted images of the biliary and pancreatic ducts were obtained, and three-dimensional MRCP images were rendered by post processing. CONTRAST:  10mL GADAVIST GADOBUTROL 1 MMOL/ML IV SOLN COMPARISON:  CT scan 02/02/2021. FINDINGS: Lower chest: Left lower lobe atelectasis noted with tiny bilateral effusions. Hepatobiliary: Liver parenchyma is heterogeneous with heterogeneous loss of signal intensity on out of phase T1 imaging, consistent with steatosis. Postcontrast imaging shows no arterial phase hyperenhancement or suspicious enhancing mass lesion. Nonvisualization of the gallbladder may be due to decompression or surgical resection. No intra or extrahepatic biliary duct dilatation. No choledocholithiasis on MRCP imaging. Pancreas: There is some edema in the around the head of the pancreas and uncinate process tracking in the anterior pararenal space along the third portion of the duodenum. No substantial duodenal wall thickening by MRI today. Postcontrast imaging shows no discrete or suspicious mass lesion within the pancreas. No dilatation of the main pancreatic duct. Spleen:  No splenomegaly. No focal mass lesion. Adrenals/Urinary Tract:  No adrenal nodule or mass. Stomach/Bowel: Stomach is unremarkable. No gastric wall thickening. No evidence of outlet obstruction. Duodenum is normally positioned as is the ligament of Treitz. No small bowel or colonic dilatation within the visualized abdomen. Vascular/Lymphatic: No abdominal aortic aneurysm. Portal vein, superior mesenteric vein, and splenic  vein are patent. Other:  No intraperitoneal free fluid. Musculoskeletal: No focal suspicious marrow enhancement within the visualized bony anatomy. IMPRESSION: 1. Mild edema in and around the head of the pancreas / uncinate process tracking in the anterior pararenal space along the third portion of the duodenum. No substantial duodenal wall thickening by MRI today. No evidence for pancreatic necrosis. No dilatation of the main pancreatic duct. 2. No biliary dilatation or evidence of choledocholithiasis. 3. Hepatic steatosis. 4. Left lower lobe atelectasis with tiny bilateral effusions. Electronically Signed   By: Kennith Center M.D.   On: 02/03/2021 20:37   MR ABDOMEN MRCP W WO CONTAST  Result Date: 02/03/2021 CLINICAL DATA:  Pancreatitis. EXAM: MRI ABDOMEN WITHOUT AND WITH CONTRAST (INCLUDING MRCP) TECHNIQUE: Multiplanar multisequence MR imaging of the abdomen was performed both before and after the administration of intravenous contrast. Heavily T2-weighted images of the biliary and pancreatic ducts were obtained, and three-dimensional MRCP images were rendered by post processing. CONTRAST:  10mL GADAVIST GADOBUTROL 1 MMOL/ML IV SOLN COMPARISON:  CT scan 02/02/2021. FINDINGS: Lower chest: Left lower lobe atelectasis noted with tiny bilateral effusions. Hepatobiliary: Liver parenchyma is heterogeneous with heterogeneous loss of signal intensity on out of phase T1 imaging, consistent with steatosis. Postcontrast imaging shows no arterial phase hyperenhancement or suspicious enhancing mass lesion. Nonvisualization of the gallbladder may be due to decompression or surgical resection. No intra or extrahepatic biliary duct dilatation. No choledocholithiasis on MRCP imaging. Pancreas: There is some edema in the around the head of the pancreas and uncinate process tracking in the anterior pararenal space along the third portion of the duodenum. No substantial duodenal wall thickening by MRI today. Postcontrast imaging  shows no discrete or suspicious mass lesion within  the pancreas. No dilatation of the main pancreatic duct. Spleen:  No splenomegaly. No focal mass lesion. Adrenals/Urinary Tract:  No adrenal nodule or mass. Stomach/Bowel: Stomach is unremarkable. No gastric wall thickening. No evidence of outlet obstruction. Duodenum is normally positioned as is the ligament of Treitz. No small bowel or colonic dilatation within the visualized abdomen. Vascular/Lymphatic: No abdominal aortic aneurysm. Portal vein, superior mesenteric vein, and splenic vein are patent. Other:  No intraperitoneal free fluid. Musculoskeletal: No focal suspicious marrow enhancement within the visualized bony anatomy. IMPRESSION: 1. Mild edema in and around the head of the pancreas / uncinate process tracking in the anterior pararenal space along the third portion of the duodenum. No substantial duodenal wall thickening by MRI today. No evidence for pancreatic necrosis. No dilatation of the main pancreatic duct. 2. No biliary dilatation or evidence of choledocholithiasis. 3. Hepatic steatosis. 4. Left lower lobe atelectasis with tiny bilateral effusions. Electronically Signed   By: Kennith CenterEric  Mansell M.D.   On: 02/03/2021 20:37      Subjective: No acute issues or events overnight tolerating p.o. well denies nausea vomiting diarrhea constipation headache fevers or chills.   Discharge Exam: Vitals:   02/04/21 2117 02/05/21 0552  BP: (!) 149/72 (!) 147/78  Pulse: 75 77  Resp: 18 18  Temp: 98.7 F (37.1 C) 98.5 F (36.9 C)  SpO2: 94% 98%   Vitals:   02/04/21 0447 02/04/21 1350 02/04/21 2117 02/05/21 0552  BP: 122/68 (!) 145/91 (!) 149/72 (!) 147/78  Pulse: 77 79 75 77  Resp: 19 16 18 18   Temp: 98.7 F (37.1 C) 98.5 F (36.9 C) 98.7 F (37.1 C) 98.5 F (36.9 C)  TempSrc: Oral Oral Oral Oral  SpO2: 92% 94% 94% 98%  Weight:      Height:        General: Pt is alert, awake, not in acute distress Cardiovascular: RRR, S1/S2 +, no  rubs, no gallops Respiratory: CTA bilaterally, no wheezing, no rhonchi Abdominal: Soft, NT, ND, bowel sounds + Extremities: no edema, no cyanosis    The results of significant diagnostics from this hospitalization (including imaging, microbiology, ancillary and laboratory) are listed below for reference.     Microbiology: Recent Results (from the past 240 hour(s))  Resp Panel by RT-PCR (Flu A&B, Covid) Nasopharyngeal Swab     Status: None   Collection Time: 02/02/21  4:16 AM   Specimen: Nasopharyngeal Swab; Nasopharyngeal(NP) swabs in vial transport medium  Result Value Ref Range Status   SARS Coronavirus 2 by RT PCR NEGATIVE NEGATIVE Final    Comment: (NOTE) SARS-CoV-2 target nucleic acids are NOT DETECTED.  The SARS-CoV-2 RNA is generally detectable in upper respiratory specimens during the acute phase of infection. The lowest concentration of SARS-CoV-2 viral copies this assay can detect is 138 copies/mL. A negative result does not preclude SARS-Cov-2 infection and should not be used as the sole basis for treatment or other patient management decisions. A negative result may occur with  improper specimen collection/handling, submission of specimen other than nasopharyngeal swab, presence of viral mutation(s) within the areas targeted by this assay, and inadequate number of viral copies(<138 copies/mL). A negative result must be combined with clinical observations, patient history, and epidemiological information. The expected result is Negative.  Fact Sheet for Patients:  BloggerCourse.comhttps://www.fda.gov/media/152166/download  Fact Sheet for Healthcare Providers:  SeriousBroker.ithttps://www.fda.gov/media/152162/download  This test is no t yet approved or cleared by the [place] FDA and  has been authorized for detection and/or diagnosis of SARS-CoV-2 by  FDA under an Emergency Use Authorization (EUA). This EUA will remain  in effect (meaning this test can be used) for the duration of  the COVID-19 declaration under Section 564(b)(1) of the Act, 21 U.S.C.section 360bbb-3(b)(1), unless the authorization is terminated  or revoked sooner.       Influenza A by PCR NEGATIVE NEGATIVE Final   Influenza B by PCR NEGATIVE NEGATIVE Final    Comment: (NOTE) The Xpert Xpress SARS-CoV-2/FLU/RSV plus assay is intended as an aid in the diagnosis of influenza from Nasopharyngeal swab specimens and should not be used as a sole basis for treatment. Nasal washings and aspirates are unacceptable for Xpert Xpress SARS-CoV-2/FLU/RSV testing.  Fact Sheet for Patients: BloggerCourse.com  Fact Sheet for Healthcare Providers: SeriousBroker.it  This test is not yet approved or cleared by the [place] FDA and has been authorized for detection and/or diagnosis of SARS-CoV-2 by FDA under an Emergency Use Authorization (EUA). This EUA will remain in effect (meaning this test can be used) for the duration of the COVID-19 declaration under Section 564(b)(1) of the Act, 21 U.S.C. section 360bbb-3(b)(1), unless the authorization is terminated or revoked.  Performed at [facility], [address] [place], [place] [address]      Labs: BNP (last 3 results) No results for input(s): BNP in the last 8760 hours. Basic Metabolic Panel: Recent Labs  Lab 02/02/21 0124 02/02/21 0905 02/03/21 0426 02/04/21 0356 02/05/21 0329  NA 136  --  133* 137 141  K 4.2  --  4.1 3.9 3.8  CL 99  --  99 103 107  CO2 24  --  24 25 28   GLUCOSE 133*  --  128* 110* 101*  BUN 18  --  13 11 9   CREATININE 1.07* 1.01* 0.78 0.77 0.78  CALCIUM 9.5  --  8.6* 8.6* 8.7*   Liver Function Tests: Recent Labs  Lab 02/02/21 0124 02/03/21 0426 02/04/21 0356 02/05/21 0329  AST 35 294* 90* 40  ALT 25 159* 96* 62*  ALKPHOS 94 131* 113 105  BILITOT 0.5 1.4* 1.0 0.8  PROT 7.8 7.1 6.7 6.8  ALBUMIN 4.4 3.9 3.6 3.6   Recent Labs  Lab 02/02/21 0124  02/03/21 0421  LIPASE 390* 107*   No results for input(s): AMMONIA in the last 168 hours. CBC: Recent Labs  Lab 02/02/21 0124 02/02/21 0905 02/03/21 0426 02/04/21 0356  WBC 11.5* 10.6* 8.5 7.3  NEUTROABS 9.4*  --   --   --   HGB 14.9 14.1 13.2 12.3  HCT 44.1 43.0 41.5 38.0  MCV 86.6 88.8 91.8 89.8  PLT 290 278 215 192   Cardiac Enzymes: No results for input(s): CKTOTAL, CKMB, CKMBINDEX, TROPONINI in the last 168 hours. BNP: Invalid input(s): POCBNP CBG: No results for input(s): GLUCAP in the last 168 hours. D-Dimer No results for input(s): DDIMER in the last 72 hours. Hgb A1c No results for input(s): HGBA1C in the last 72 hours. Lipid Profile Recent Labs    02/02/21 0900  CHOL 201*  HDL 41  LDLCALC 133*  TRIG 134  CHOLHDL 4.9   Thyroid function studies No results for input(s): TSH, T4TOTAL, T3FREE, THYROIDAB in the last 72 hours.  Invalid input(s): FREET3 Anemia work up No results for input(s): VITAMINB12, FOLATE, FERRITIN, TIBC, IRON, RETICCTPCT in the last 72 hours. Urinalysis    Component Value Date/Time   COLORURINE YELLOW 02/02/2021 0124   APPEARANCEUR CLEAR 02/02/2021 0124   LABSPEC 1.020 02/02/2021 0124   PHURINE  7.0 02/02/2021 0124   GLUCOSEU NEGATIVE 02/02/2021 0124   HGBUR NEGATIVE 02/02/2021 0124   BILIRUBINUR NEGATIVE 02/02/2021 0124   KETONESUR NEGATIVE 02/02/2021 0124   PROTEINUR NEGATIVE 02/02/2021 0124   UROBILINOGEN 0.2 07/18/2015 0250   NITRITE NEGATIVE 02/02/2021 0124   LEUKOCYTESUR NEGATIVE 02/02/2021 0124   Sepsis Labs Invalid input(s): PROCALCITONIN,  WBC,  LACTICIDVEN Microbiology Recent Results (from the past 240 hour(s))  Resp Panel by RT-PCR (Flu A&B, Covid) Nasopharyngeal Swab     Status: None   Collection Time: 02/02/21  4:16 AM   Specimen: Nasopharyngeal Swab; Nasopharyngeal(NP) swabs in vial transport medium  Result Value Ref Range Status   SARS Coronavirus 2 by RT PCR NEGATIVE NEGATIVE Final    Comment:  (NOTE) SARS-CoV-2 target nucleic acids are NOT DETECTED.  The SARS-CoV-2 RNA is generally detectable in upper respiratory specimens during the acute phase of infection. The lowest concentration of SARS-CoV-2 viral copies this assay can detect is 138 copies/mL. A negative result does not preclude SARS-Cov-2 infection and should not be used as the sole basis for treatment or other patient management decisions. A negative result may occur with  improper specimen collection/handling, submission of specimen other than nasopharyngeal swab, presence of viral mutation(s) within the areas targeted by this assay, and inadequate number of viral copies(<138 copies/mL). A negative result must be combined with clinical observations, patient history, and epidemiological information. The expected result is Negative.  Fact Sheet for Patients:  BloggerCourse.com  Fact Sheet for Healthcare Providers:  SeriousBroker.it  This test is no t yet approved or cleared by the [place] FDA and  has been authorized for detection and/or diagnosis of SARS-CoV-2 by FDA under an Emergency Use Authorization (EUA). This EUA will remain  in effect (meaning this test can be used) for the duration of the COVID-19 declaration under Section 564(b)(1) of the Act, 21 U.S.C.section 360bbb-3(b)(1), unless the authorization is terminated  or revoked sooner.       Influenza A by PCR NEGATIVE NEGATIVE Final   Influenza B by PCR NEGATIVE NEGATIVE Final    Comment: (NOTE) The Xpert Xpress SARS-CoV-2/FLU/RSV plus assay is intended as an aid in the diagnosis of influenza from Nasopharyngeal swab specimens and should not be used as a sole basis for treatment. Nasal washings and aspirates are unacceptable for Xpert Xpress SARS-CoV-2/FLU/RSV testing.  Fact Sheet for Patients: BloggerCourse.com  Fact Sheet for Healthcare  Providers: SeriousBroker.it  This test is not yet approved or cleared by the [place] FDA and has been authorized for detection and/or diagnosis of SARS-CoV-2 by FDA under an Emergency Use Authorization (EUA). This EUA will remain in effect (meaning this test can be used) for the duration of the COVID-19 declaration under Section 564(b)(1) of the Act, 21 U.S.C. section 360bbb-3(b)(1), unless the authorization is terminated or revoked.  Performed at [facility], [address] [place], [place] [address]      Time coordinating discharge: Over 30 minutes  SIGNED:   Azucena Fallen, DO [facility] 02/05/2021, 7:54 AM Pager   If 7PM-7AM, please contact night-coverage [email or web address]

## 2021-04-04 ENCOUNTER — Ambulatory Visit: Payer: Medicare Other | Attending: Internal Medicine

## 2021-04-04 DIAGNOSIS — Z23 Encounter for immunization: Secondary | ICD-10-CM

## 2021-04-04 NOTE — Progress Notes (Signed)
   Covid-19 Vaccination Clinic  Name:  Parrish Daddario    MRN: [id number] DOB: 08/10/1966  04/04/2021  Ms. Janelle Floor was observed post Covid-19 immunization for 15 minutes without incident. She was provided with Vaccine Information Sheet and instruction to access the V-Safe system.   Ms. Janelle Floor was instructed to call 911 with any severe reactions post vaccine: Marland Kitchen Difficulty breathing  . Swelling of face and throat  . A fast heartbeat  . A bad rash all over body  . Dizziness and weakness   Immunizations Administered    Name Date Dose VIS Date Route   Moderna Covid-19 Booster Vaccine 04/04/2021  2:02 PM 0.25 mL 09/18/2020 Intramuscular   Manufacturer: Moderna   Lot: [id number]   NDC: 35009-381-82

## 2021-04-10 ENCOUNTER — Ambulatory Visit: Payer: Medicare Other | Admitting: Medical

## 2021-04-11 ENCOUNTER — Other Ambulatory Visit (HOSPITAL_BASED_OUTPATIENT_CLINIC_OR_DEPARTMENT_OTHER): Payer: Self-pay

## 2021-04-11 MED ORDER — MODERNA COVID-19 VACCINE 100 MCG/0.5ML IM SUSP
INTRAMUSCULAR | 0 refills | Status: AC
  Filled 2021-04-11: qty 0.25, 1d supply, fill #0

## 2021-08-05 ENCOUNTER — Other Ambulatory Visit (HOSPITAL_BASED_OUTPATIENT_CLINIC_OR_DEPARTMENT_OTHER): Payer: Self-pay

## 2022-03-10 ENCOUNTER — Emergency Department (HOSPITAL_BASED_OUTPATIENT_CLINIC_OR_DEPARTMENT_OTHER): Payer: Medicare Other

## 2022-03-10 ENCOUNTER — Encounter (HOSPITAL_BASED_OUTPATIENT_CLINIC_OR_DEPARTMENT_OTHER): Payer: Self-pay

## 2022-03-10 ENCOUNTER — Other Ambulatory Visit: Payer: Self-pay

## 2022-03-10 ENCOUNTER — Emergency Department (HOSPITAL_BASED_OUTPATIENT_CLINIC_OR_DEPARTMENT_OTHER)
Admission: EM | Admit: 2022-03-10 | Discharge: 2022-03-10 | Disposition: A | Discharge status: Home / Self Care | Payer: Medicare Other | Attending: Emergency Medicine | Admitting: Emergency Medicine

## 2022-03-10 DIAGNOSIS — M722 Plantar fascial fibromatosis: Secondary | ICD-10-CM | POA: Insufficient documentation

## 2022-03-10 DIAGNOSIS — Z79899 Other long term (current) drug therapy: Secondary | ICD-10-CM | POA: Diagnosis not present

## 2022-03-10 DIAGNOSIS — M79672 Pain in left foot: Secondary | ICD-10-CM | POA: Diagnosis present

## 2022-03-10 NOTE — Discharge Instructions (Addendum)
Como comentamos, sospecho que su dolor se debe a una fascitis plantar. He adjuntado un paquete de informaci?n sobre este diagn?stico a su documentaci?n. Recomiendo reforzar con estiramientos, hielo y tylenol/ibuprofeno seg?n sea necesario para Chief Technology Officer. Regresar si se desarrollan s?ntomas nuevos o que empeoran ?

## 2022-03-10 NOTE — ED Triage Notes (Signed)
Pt c/o swelling in left ankle down to foot. States was walking 15 days ago and started swelling, swelling improved with ice.  ? ?Spanish interpreter used during triage [place] [phone] ?

## 2022-03-10 NOTE — ED Notes (Signed)
Pt not in room upon discharge.  

## 2022-03-10 NOTE — ED Provider Notes (Signed)
?[facility] EMERGENCY DEPARTMENT ?Provider Note ? ? ?CSN: [id number] ?Arrival date & time: 03/10/22  1058 ? ?  ? ?History ? ?Chief Complaint  ?Patient presents with  ? Joint Swelling  ? ? ?Virginia King is a 56 y.o. female. ? ?Patient with no pertinent past medical history presents today with complaints of left foot pain.  She states that same began around 2 weeks ago and has been persistent since.  She states that it is worse in the morning when she steps out of bed onto the floor. She denies any specific injury to same. She has been attempting to manage at home with ibuprofen and tylenol and ice with some improvement. She denies any fevers, chills, chest pain, shortness of breath, nausea, vomiting, or diarrhea. She is able to walk with some pain. ? ?The history is provided by the patient. A language interpreter was used.  ? ?  ? ?Home Medications ?Prior to Admission medications   ?Medication Sig Start Date End Date Taking? Authorizing Provider  ?COVID-19 mRNA vaccine, Moderna, (MODERNA COVID-19 VACCINE) 100 MCG/0.5ML injection Inject into the muscle. 04/04/21   Judyann Munson, MD  ?diclofenac (VOLTAREN) 75 MG EC tablet Take 75 mg by mouth daily as needed for pain. 01/30/21   [provider]  ?HYDROcodone-acetaminophen (NORCO/VICODIN) 5-325 MG tablet Take 1-2 tablets by mouth every 6 (six) hours as needed for severe pain. 02/03/17   Vassie Loll, MD  ?hydrocortisone (ANUSOL-HC) 2.5 % rectal cream Place 1 application rectally 2 (two) times daily. ?Patient not taking: No sig reported 08/09/20   Towanda Octave, MD  ?lisinopril-hydrochlorothiazide (ZESTORETIC) 20-25 MG tablet Take 1 tablet by mouth daily. 01/30/21   [provider]  ?omega-3 fish oil (MAXEPA) 1000 MG CAPS capsule Take 1 capsule by mouth daily.    [provider]  ?omeprazole (PRILOSEC) 20 MG capsule Take 20 mg by mouth daily. 06/10/20 02/02/21  [provider]  ?ondansetron (ZOFRAN ODT) 8 MG disintegrating  tablet Take 1 tablet (8 mg total) by mouth every 8 (eight) hours as needed for nausea or vomiting. ?Patient not taking: No sig reported 02/03/17   Vassie Loll, MD  ?paliperidone (INVEGA) 9 MG 24 hr tablet Take 9 mg by mouth daily.    [provider]  ?polyethylene glycol (MIRALAX) 17 g packet Take 17 g by mouth daily. ?Patient not taking: No sig reported 08/09/20   Towanda Octave, MD  ?sertraline (ZOLOFT) 100 MG tablet Take 100 mg by mouth daily.    [provider]  ?zinc sulfate 220 (50 Zn) MG capsule Take 220 mg by mouth daily.    [provider]  ?zolpidem (AMBIEN) 10 MG tablet Take 10 mg by mouth at bedtime as needed for sleep.    [provider]  ?   ? ?Allergies    ?Patient has no known allergies.   ? ?Review of Systems   ?Review of Systems  ?Constitutional:  Negative for chills and fever.  ?Musculoskeletal:  Positive for arthralgias and myalgias. Negative for joint swelling, neck pain and neck stiffness.  ?Skin:  Negative for rash and wound.  ?All other systems reviewed and are negative. ? ?Physical Exam ?Updated Vital Signs ?BP 131/89   Pulse 84   Temp 98.5 ?F (36.9 ?C)   Resp 18   Ht 5\' 3"  (1.6 m)   Wt 98.4 kg   SpO2 100%   BMI 38.44 kg/m?  ?Physical Exam ?Vitals and nursing note reviewed.  ?Constitutional:   ?  General: She is not in acute distress. ?   Appearance: Normal appearance. She is normal weight. She is not ill-appearing, toxic-appearing or diaphoretic.  ?   Comments: Patient resting comfortably in bed in no acute distress  ?HENT:  ?   Head: Normocephalic and atraumatic.  ?Cardiovascular:  ?   Rate and Rhythm: Normal rate.  ?Pulmonary:  ?   Effort: Pulmonary effort is normal. No respiratory distress.  ?Musculoskeletal:     ?   General: Normal range of motion.  ?   Cervical back: Normal range of motion.  ?   Comments: Tenderness to palpation of the left heel reproduced to dorsiflexion of the left toe. No obvious swelling, erythema, warmth, or deformity  noted to the left foot or ankle. Full ROM intact with some pain. DP and PT pulses intact and 2+  ?Skin: ?   General: Skin is warm and dry.  ?Neurological:  ?   General: No focal deficit present.  ?   Mental Status: She is alert.  ?Psychiatric:     ?   Mood and Affect: Mood normal.     ?   Behavior: Behavior normal.  ? ? ?ED Results / Procedures / Treatments   ?Labs ?(all labs ordered are listed, but only abnormal results are displayed) ?Labs Reviewed - No data to display ? ?EKG ?None ? ?Radiology ?DG Ankle Complete Left ? ?Result Date: 03/10/2022 ?CLINICAL DATA:  Ankle pain 2 weeks.  No injury EXAM: LEFT ANKLE COMPLETE - 3+ VIEW COMPARISON:  None. FINDINGS: Normal alignment no fracture. Small ossicles adjacent to medial malleolus likely represent chronic injury. IMPRESSION: No acute injury or fracture. Electronically Signed   By: Franchot Gallo M.D.   On: 03/10/2022 11:28   ? ?Procedures ?Procedures  ? ? ?Medications Ordered in ED ?Medications - No data to display ? ?ED Course/ Medical Decision Making/ A&P ?  ?                        ?Medical Decision Making ?Amount and/or Complexity of Data Reviewed ?Radiology: ordered. ? ? ?Patient presents today with left foot pain x 2 weeks that is worse in the morning when she steps onto the floor. Patient X-Ray negative for obvious fracture or dislocation.  ? ?I, Lavonna Rua, PA-C, personally reviewed and evaluated these image results supported by medical decision making ? ?Patients symptoms consistent with plantar fasciitis. Patient given brace while in ED, conservative therapy with stretching exercises recommended and discussed. Patient will be dc home & is agreeable with above plan.  Gated on red flag symptoms of prompt immediate return.  Discharged in stable condition. ? ? ?Final Clinical Impression(s) / ED Diagnoses ?Final diagnoses:  ?Plantar fasciitis of left foot  ? ? ?Rx / DC Orders ?ED Discharge Orders   ? ? None  ? ?  ?An After Visit Summary was printed and given to  the patient. ? ? ?  ?Bud Face, PA-C ?03/10/22 1622 ? ?  ?Lorelle Gibbs, DO ?03/13/22 1225 ? ?

## 2023-05-03 ENCOUNTER — Ambulatory Visit: Payer: Medicare Other | Admitting: Emergency Medicine

## 2024-03-23 ENCOUNTER — Emergency Department (HOSPITAL_BASED_OUTPATIENT_CLINIC_OR_DEPARTMENT_OTHER)

## 2024-03-23 ENCOUNTER — Other Ambulatory Visit: Payer: Self-pay

## 2024-03-23 ENCOUNTER — Emergency Department (HOSPITAL_BASED_OUTPATIENT_CLINIC_OR_DEPARTMENT_OTHER)
Admission: EM | Admit: 2024-03-23 | Discharge: 2024-03-23 | Disposition: A | Discharge status: Home / Self Care | Attending: Emergency Medicine | Admitting: Emergency Medicine

## 2024-03-23 ENCOUNTER — Encounter (HOSPITAL_BASED_OUTPATIENT_CLINIC_OR_DEPARTMENT_OTHER): Payer: Self-pay | Admitting: Emergency Medicine

## 2024-03-23 DIAGNOSIS — R112 Nausea with vomiting, unspecified: Secondary | ICD-10-CM

## 2024-03-23 DIAGNOSIS — R300 Dysuria: Secondary | ICD-10-CM | POA: Insufficient documentation

## 2024-03-23 DIAGNOSIS — Z79899 Other long term (current) drug therapy: Secondary | ICD-10-CM | POA: Insufficient documentation

## 2024-03-23 DIAGNOSIS — R1013 Epigastric pain: Secondary | ICD-10-CM | POA: Diagnosis not present

## 2024-03-23 DIAGNOSIS — R930 Abnormal findings on diagnostic imaging of skull and head, not elsewhere classified: Secondary | ICD-10-CM | POA: Diagnosis not present

## 2024-03-23 DIAGNOSIS — I3139 Other pericardial effusion (noninflammatory): Secondary | ICD-10-CM | POA: Insufficient documentation

## 2024-03-23 DIAGNOSIS — R42 Dizziness and giddiness: Secondary | ICD-10-CM

## 2024-03-23 LAB — CBC WITH DIFFERENTIAL/PLATELET
Abs Immature Granulocytes: 0.02 10*3/uL (ref 0.00–0.07)
Basophils Absolute: 0 10*3/uL (ref 0.0–0.1)
Basophils Relative: 0 %
Eosinophils Absolute: 0.1 10*3/uL (ref 0.0–0.5)
Eosinophils Relative: 2 %
HCT: 41.6 % (ref 36.0–46.0)
Hemoglobin: 14.4 g/dL (ref 12.0–15.0)
Immature Granulocytes: 0 %
Lymphocytes Relative: 36 %
Lymphs Abs: 2.3 10*3/uL (ref 0.7–4.0)
MCH: 30.8 pg (ref 26.0–34.0)
MCHC: 34.6 g/dL (ref 30.0–36.0)
MCV: 88.9 fL (ref 80.0–100.0)
Monocytes Absolute: 0.5 10*3/uL (ref 0.1–1.0)
Monocytes Relative: 8 %
Neutro Abs: 3.5 10*3/uL (ref 1.7–7.7)
Neutrophils Relative %: 54 %
Platelets: 231 10*3/uL (ref 150–400)
RBC: 4.68 MIL/uL (ref 3.87–5.11)
RDW: 13.4 % (ref 11.5–15.5)
WBC: 6.2 10*3/uL (ref 4.0–10.5)
nRBC: 0 % (ref 0.0–0.2)

## 2024-03-23 LAB — LIPASE, BLOOD: Lipase: 72 U/L — ABNORMAL HIGH (ref 11–51)

## 2024-03-23 LAB — COMPREHENSIVE METABOLIC PANEL WITH GFR
ALT: 12 U/L (ref 0–44)
AST: 23 U/L (ref 15–41)
Albumin: 4.4 g/dL (ref 3.5–5.0)
Alkaline Phosphatase: 96 U/L (ref 38–126)
Anion gap: 13 (ref 5–15)
BUN: 19 mg/dL (ref 6–20)
CO2: 24 mmol/L (ref 22–32)
Calcium: 9.5 mg/dL (ref 8.9–10.3)
Chloride: 103 mmol/L (ref 98–111)
Creatinine, Ser: 1.05 mg/dL — ABNORMAL HIGH (ref 0.44–1.00)
GFR, Estimated: >60 mL/min (ref >60)
Glucose, Bld: 80 mg/dL (ref 70–99)
Potassium: 4.5 mmol/L (ref 3.5–5.1)
Sodium: 140 mmol/L (ref 135–145)
Total Bilirubin: 0.3 mg/dL (ref 0.0–1.2)
Total Protein: 7.1 g/dL (ref 6.5–8.1)

## 2024-03-23 LAB — CBG MONITORING, ED: Glucose-Capillary: 92 mg/dL (ref 70–99)

## 2024-03-23 LAB — URINALYSIS, ROUTINE W REFLEX MICROSCOPIC
Bilirubin Urine: NEGATIVE
Glucose, UA: NEGATIVE mg/dL
Hgb urine dipstick: NEGATIVE
Ketones, ur: NEGATIVE mg/dL
Leukocytes,Ua: NEGATIVE
Nitrite: NEGATIVE
Protein, ur: NEGATIVE mg/dL
Specific Gravity, Urine: 1.01 (ref 1.005–1.030)
pH: 6 (ref 5.0–8.0)

## 2024-03-23 MED ORDER — ONDANSETRON 4 MG PO TBDP: 4.0000 mg | ORAL_TABLET | Freq: Three times a day (TID) | ORAL | 0 refills | Status: AC | PRN

## 2024-03-23 MED ORDER — ONDANSETRON 4 MG PO TBDP
4.0000 mg | ORAL_TABLET | Freq: Once | ORAL | Status: AC
  Administered 2024-03-23: 4 mg via ORAL
  Filled 2024-03-23: qty 1

## 2024-03-23 MED ORDER — IOHEXOL 300 MG/ML  SOLN
75.0000 mL | Freq: Once | INTRAMUSCULAR | Status: AC | PRN
  Administered 2024-03-23: 100 mL via INTRAVENOUS

## 2024-03-23 MED ORDER — MECLIZINE HCL 25 MG PO TABS
25.0000 mg | ORAL_TABLET | Freq: Once | ORAL | Status: AC
  Administered 2024-03-23: 25 mg via ORAL
  Filled 2024-03-23: qty 1

## 2024-03-23 MED ORDER — IOHEXOL 350 MG/ML SOLN
75.0000 mL | Freq: Once | INTRAVENOUS | Status: AC | PRN
  Administered 2024-03-23: 100 mL via INTRAVENOUS

## 2024-03-23 MED ORDER — SODIUM CHLORIDE 0.9 % IV BOLUS
1000.0000 mL | Freq: Once | INTRAVENOUS | Status: AC
  Administered 2024-03-23: 1000 mL via INTRAVENOUS

## 2024-03-23 NOTE — ED Notes (Signed)
 Patient transported to CT

## 2024-03-23 NOTE — Discharge Instructions (Signed)
 Follow-up with your primary care doctor regarding today's visit.  Further discuss your dizziness or nausea vomiting abdominal pain.  As I told you incidentally you have a small amount of fluid around your heart which they may need to follow-up with a heart ultrasound but overall this is an incidental finding.  If you develop any severe chest pain shortness of breath please return for evaluation.  Return if other symptoms worsen as well.

## 2024-03-23 NOTE — ED Triage Notes (Signed)
 Teleinterpreter Karyl Paget [phone]-   Pt pov c/o dizziness x1 month, more frequently in last week.  Emesis, heartburn. One emesis occurrence in triage. Denies fever. Reports mild burning with urination x8-10 days.   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 Pt also reports being hard of hearing.

## 2024-03-23 NOTE — ED Provider Notes (Signed)
 [place] [facility] AT [facility] Provider Note   CSN: [id number] Arrival date & time: 03/23/24  1910     History  Chief Complaint  Patient presents with   Dizziness   Dysuria   Emesis    Virginia King is a 58 y.o. female.  Patient here mostly for nausea and vomiting upper abdominal pain.  History of gastric bypass.  Virginia King is also has some intermittent dizziness the last month.  But main symptoms today are upper abdominal pain.  Some pain with urination.  Has had some nausea and vomiting.  No diarrhea.  Nothing makes it worse or better.  Denies any chest pain shortness of breath weakness numbness tingling.  History of depression.  Denies any headache.  The history is provided by the patient. A language interpreter was used.       Home Medications Prior to Admission medications   Medication Sig Start Date End Date Taking? Authorizing Provider  ondansetron  (ZOFRAN -ODT) 4 MG disintegrating tablet Take 1 tablet (4 mg total) by mouth every 8 (eight) hours as needed. 03/23/24  Yes Oseas Detty, DO  COVID-19 mRNA vaccine, Moderna, (MODERNA COVID-19 VACCINE ) 100 MCG/0.5ML injection Inject into the muscle. 04/04/21   Liane Redman, MD  diclofenac (VOLTAREN) 75 MG EC tablet Take 75 mg by mouth daily as needed for pain. 01/30/21   [provider]  HYDROcodone -acetaminophen  (NORCO/VICODIN) 5-325 MG tablet Take 1-2 tablets by mouth every 6 (six) hours as needed for severe pain. 02/03/17   Justina Oman, MD  hydrocortisone  (ANUSOL -HC) 2.5 % rectal cream Place 1 application rectally 2 (two) times daily. Patient not taking: No sig reported 08/09/20   Patel, Poonamkumari J, MD  lisinopril-hydrochlorothiazide (ZESTORETIC) 20-25 MG tablet Take 1 tablet by mouth daily. 01/30/21   [provider]  omega-3 fish oil (MAXEPA) 1000 MG CAPS capsule Take 1 capsule by mouth daily.    [provider]  omeprazole (PRILOSEC) 20 MG capsule Take 20 mg by  mouth daily. 06/10/20 02/02/21  [provider]  paliperidone  (INVEGA ) 9 MG 24 hr tablet Take 9 mg by mouth daily.    [provider]  polyethylene glycol (MIRALAX ) 17 g packet Take 17 g by mouth daily. Patient not taking: No sig reported 08/09/20   Geoffry Kill, MD  sertraline  (ZOLOFT ) 100 MG tablet Take 100 mg by mouth daily.    [provider]  zinc sulfate 220 (50 Zn) MG capsule Take 220 mg by mouth daily.    [provider]  zolpidem  (AMBIEN ) 10 MG tablet Take 10 mg by mouth at bedtime as needed for sleep.    [provider]      Allergies    Patient has no known allergies.    Review of Systems   Review of Systems  Physical Exam Updated Vital Signs BP 119/67 (BP Location: Left Arm)   Pulse (!) 54   Temp 97.6 F (36.4 C) (Oral)   Resp 16   Ht 5\' 3"  (1.6 m)   Wt 74.8 kg   SpO2 99%   BMI 29.23 kg/m  Physical Exam Vitals and nursing note reviewed.  Constitutional:      General: Virginia King is not in acute distress.    Appearance: Virginia King is well-developed. Virginia King is not ill-appearing.  HENT:     Head: Normocephalic and atraumatic.     Nose: Nose normal.     Mouth/Throat:     Mouth: Mucous membranes are moist.     Pharynx:  Oropharynx is clear.  Eyes:     Extraocular Movements: Extraocular movements intact.     Conjunctiva/sclera: Conjunctivae normal.     Pupils: Pupils are equal, round, and reactive to light.  Cardiovascular:     Rate and Rhythm: Regular rhythm.     Pulses: Normal pulses.     Heart sounds: Normal heart sounds. No murmur heard. Pulmonary:     Effort: Pulmonary effort is normal. No respiratory distress.     Breath sounds: Normal breath sounds.  Abdominal:     General: Abdomen is flat. There is no distension.     Palpations: Abdomen is soft.     Tenderness: There is no abdominal tenderness.  Musculoskeletal:        General: No swelling.     Cervical back: Normal range of motion and neck supple.  Skin:     General: Skin is warm and dry.     Capillary Refill: Capillary refill takes less than 2 seconds.  Neurological:     General: No focal deficit present.     Mental Status: Virginia King is alert and oriented to person, place, and time.     Cranial Nerves: No cranial nerve deficit.     Sensory: No sensory deficit.     Motor: No weakness.     Coordination: Coordination normal.     Comments: Normal strength, normal sensation, normal speech, no drift, normal visual fields  Psychiatric:        Mood and Affect: Mood normal.     ED Results / Procedures / Treatments   Labs (all labs ordered are listed, but only abnormal results are displayed) Labs Reviewed  COMPREHENSIVE METABOLIC PANEL WITH GFR - Abnormal; Notable for the following components:      Result Value   Creatinine, Ser 1.05 (*)    All other components within normal limits  LIPASE, BLOOD - Abnormal; Notable for the following components:   Lipase 72 (*)    All other components within normal limits  CBC WITH DIFFERENTIAL/PLATELET  URINALYSIS, ROUTINE W REFLEX MICROSCOPIC  CBG MONITORING, ED    EKG EKG Interpretation Date/Time:  Thursday March 23 2024 19:23:26 EDT Ventricular Rate:  63 PR Interval:  143 QRS Duration:  89 QT Interval:  386 QTC Calculation: 396 R Axis:   63  Text Interpretation: Sinus rhythm Low voltage, precordial leads Confirmed by Lowery Rue [phone]) on 03/23/2024 7:47:59 PM  Radiology CT ABDOMEN PELVIS W CONTRAST Result Date: 03/23/2024 CLINICAL DATA:  Acute abdominal pain EXAM: CT ABDOMEN AND PELVIS WITH CONTRAST TECHNIQUE: Multidetector CT imaging of the abdomen and pelvis was performed using the standard protocol following bolus administration of intravenous contrast. RADIATION DOSE REDUCTION: This exam was performed according to the departmental dose-optimization program which includes automated exposure control, adjustment of the mA and/or kV according to patient size and/or use of iterative reconstruction  technique. CONTRAST:  OMNIPAQUE  IOHEXOL  300 MG/ML  SOLN COMPARISON:  CT abdomen and pelvis 02/02/2021. FINDINGS: Lower chest: There is a small pericardial effusion. Hepatobiliary: No focal liver abnormality is seen. Status post cholecystectomy. No biliary dilatation. Pancreas: Unremarkable. No pancreatic ductal dilatation or surrounding inflammatory changes. Spleen: Normal in size without focal abnormality. Adrenals/Urinary Tract: Adrenal glands are unremarkable. Kidneys are normal, without renal calculi, focal lesion, or hydronephrosis. Bladder is unremarkable. Stomach/Bowel: There are postsurgical changes in the stomach, new from prior. Appendix appears normal. No evidence of bowel wall thickening, distention, or inflammatory changes. Vascular/Lymphatic: Choose 1 Reproductive: Status post hysterectomy. No adnexal masses. Other:  No abdominal wall hernia or abnormality. No abdominopelvic ascites. Musculoskeletal: No acute or significant osseous findings. IMPRESSION: 1. No acute localizing process in the abdomen or pelvis. 2. Small pericardial effusion. 3. Postsurgical changes in the stomach, new from prior. Electronically Signed   By: Tyron Gallon M.D.   On: 03/23/2024 22:10   CT ANGIO HEAD NECK W WO CM Result Date: 03/23/2024 CLINICAL DATA:  Acute neurologic deficit EXAM: CT ANGIOGRAPHY HEAD AND NECK WITH AND WITHOUT CONTRAST TECHNIQUE: Multidetector CT imaging of the head and neck was performed using the standard protocol during bolus administration of intravenous contrast. Multiplanar CT image reconstructions and MIPs were obtained to evaluate the vascular anatomy. Carotid stenosis measurements (when applicable) are obtained utilizing NASCET criteria, using the distal internal carotid diameter as the denominator. RADIATION DOSE REDUCTION: This exam was performed according to the departmental dose-optimization program which includes automated exposure control, adjustment of the mA and/or kV according to  patient size and/or use of iterative reconstruction technique. CONTRAST:  OMNIPAQUE  IOHEXOL  350 MG/ML SOLN COMPARISON:  CTA neck 07/18/2015 FINDINGS: CT HEAD FINDINGS Brain: No mass,hemorrhage or extra-axial collection. Normal appearance of the parenchyma and CSF spaces. Vascular: No hyperdense vessel or unexpected vascular calcification. Skull: The visualized skull base, calvarium and extracranial soft tissues are normal. Sinuses/Orbits: No fluid levels or advanced mucosal thickening of the visualized paranasal sinuses. No mastoid or middle ear effusion. Normal orbits. CTA NECK FINDINGS Skeleton: No acute abnormality or high grade bony spinal canal stenosis. Other neck: Normal pharynx, larynx and major salivary glands. No cervical lymphadenopathy. Unremarkable thyroid gland. Upper chest: No pneumothorax or pleural effusion. No nodules or masses. Aortic arch: There is calcific atherosclerosis of the aortic arch. Normal variant aortic arch branching pattern with the brachiocephalic and left common carotid arteries sharing a common origin. RIGHT carotid system: Normal without aneurysm, dissection or stenosis. LEFT carotid system: Normal without aneurysm, dissection or stenosis. Vertebral arteries: Codominant configuration. There is no dissection, occlusion or flow-limiting stenosis to the skull base (V1-V3 segments). CTA HEAD FINDINGS POSTERIOR CIRCULATION: Vertebral arteries are normal. No proximal occlusion of the anterior or inferior cerebellar arteries. Basilar artery is normal. Superior cerebellar arteries are normal. Posterior cerebral arteries are normal. ANTERIOR CIRCULATION: Intracranial internal carotid arteries are normal. Previously described paraophthalmic aneurysm is not visualized. Anterior cerebral arteries are normal. Middle cerebral arteries are normal. Venous sinuses: As permitted by contrast timing, patent. Anatomic variants: None Review of the MIP images confirms the above findings.  IMPRESSION: 1. No emergent large vessel occlusion or high-grade stenosis of the intracranial arteries. 2. Previously described paraophthalmic aneurysm is not visualized. Aortic Atherosclerosis (ICD10-I70.0). Electronically Signed   By: Juanetta Nordmann M.D.   On: 03/23/2024 21:51    Procedures Procedures    Medications Ordered in ED Medications  ondansetron  (ZOFRAN -ODT) disintegrating tablet 4 mg (4 mg Oral Given 03/23/24 1929)  sodium chloride  0.9 % bolus 1,000 mL (0 mLs Intravenous Stopped 03/23/24 2210)  meclizine  (ANTIVERT ) tablet 25 mg (25 mg Oral Given 03/23/24 2135)  iohexol  (OMNIPAQUE ) 350 MG/ML injection 75 mL (100 mLs Intravenous Contrast Given 03/23/24 2036)  iohexol  (OMNIPAQUE ) 300 MG/ML solution 75 mL (100 mLs Intravenous Contrast Given 03/23/24 2037)    ED Course/ Medical Decision Making/ A&P                                 Medical Decision Making Amount and/or Complexity of Data Reviewed Labs: ordered. Radiology: ordered.  Risk Prescription drug management.   Danne Sanchez Carnero is here dizziness abdominal pain nausea vomiting.  Normal vitals.  No fever.  History of depression.  History of gastric bypass.  Neurologically Virginia King is intact.  Intermittent dizziness for the last month.  Will get a CT of the head and neck but have low suspicion for stroke.  Virginia King is neurologically intact.  This could be related to nausea and vomiting.  This could be peripheral vertigo.  Virginia King not have any chest pain or shortness of breath.  I have no concern for cardiac or pulmonary process.  Could be bowel obstruction colitis Food poisoning viral process.  Will check CBC CMP lipase CT abdomen pelvis.  EKG shows sinus rhythm.  No ischemic changes.  I reviewed interpreted EKG.  Lab work shows no significant leukocytosis anemia or electrolyte abnormality.  Gallbladder liver enzymes lipase all unremarkable.  CT scan abdomen pelvis showed no acute findings.  CTA of the head and neck showed no acute  findings.  Incidentally on images there is a small pericardial effusion.  Virginia King is not having any symptoms from this standpoint.  Will have her follow-up with primary care doctor to maybe consider doing an ultrasound but at this time I think that this is incidental.  Not have any chest pain shortness of breath.  No signs of volume overload on exam.  No friction rub.  Interpreter was used throughout patient's care.  Discharged in good condition.  Will prescribe Zofran .  Follow-up with primary care return if symptoms worsen.  This chart was dictated using voice recognition software.  Despite best efforts to proofread,  errors can occur which can change the documentation meaning.         Final Clinical Impression(s) / ED Diagnoses Final diagnoses:  Dizziness  Nausea and vomiting, unspecified vomiting type  Epigastric pain  Pericardial effusion    Rx / DC Orders ED Discharge Orders          Ordered    ondansetron  (ZOFRAN -ODT) 4 MG disintegrating tablet  Every 8 hours PRN        03/23/24 2225              Lowery Rue, DO 03/23/24 2231

## 2024-03-23 NOTE — ED Notes (Signed)
 Pt reports dizziness has been going on for 1 month Also reports heartburn with vomiting x 2 today and dysuria
